# Patient Record
Sex: Female | Born: 1985 | Race: Black or African American | Hispanic: No | Marital: Single | Smoking: Never smoker
Health system: Southern US, Community
[De-identification: ages and names within clinical notes are randomized; demographics above are authoritative.]

## PROBLEM LIST (undated history)

## (undated) DIAGNOSIS — E119 Type 2 diabetes mellitus without complications: Secondary | ICD-10-CM

## (undated) DIAGNOSIS — J45909 Unspecified asthma, uncomplicated: Secondary | ICD-10-CM

## (undated) DIAGNOSIS — I10 Essential (primary) hypertension: Secondary | ICD-10-CM

## (undated) HISTORY — PX: NO PAST SURGERIES: SHX2092

## (undated) HISTORY — PX: SLEEVE GASTROPLASTY: SHX1101

---

## 2010-10-02 ENCOUNTER — Emergency Department (HOSPITAL_COMMUNITY): Payer: Commercial Managed Care - PPO

## 2010-10-02 ENCOUNTER — Emergency Department (HOSPITAL_COMMUNITY)
Admission: EM | Admit: 2010-10-02 | Discharge: 2010-10-02 | Disposition: A | Discharge status: Home / Self Care | Payer: Commercial Managed Care - PPO | Attending: Emergency Medicine | Admitting: Emergency Medicine

## 2010-10-02 DIAGNOSIS — X500XXA Overexertion from strenuous movement or load, initial encounter: Secondary | ICD-10-CM | POA: Insufficient documentation

## 2010-10-02 DIAGNOSIS — M25579 Pain in unspecified ankle and joints of unspecified foot: Secondary | ICD-10-CM | POA: Insufficient documentation

## 2010-10-02 DIAGNOSIS — S93409A Sprain of unspecified ligament of unspecified ankle, initial encounter: Secondary | ICD-10-CM | POA: Insufficient documentation

## 2010-10-02 DIAGNOSIS — Y92009 Unspecified place in unspecified non-institutional (private) residence as the place of occurrence of the external cause: Secondary | ICD-10-CM | POA: Insufficient documentation

## 2012-10-06 ENCOUNTER — Emergency Department (HOSPITAL_COMMUNITY)
Admission: EM | Admit: 2012-10-06 | Discharge: 2012-10-06 | Disposition: A | Discharge status: Home / Self Care | Payer: Self-pay | Attending: Emergency Medicine | Admitting: Emergency Medicine

## 2012-10-06 ENCOUNTER — Encounter (HOSPITAL_COMMUNITY): Payer: Self-pay | Admitting: Emergency Medicine

## 2012-10-06 DIAGNOSIS — J45909 Unspecified asthma, uncomplicated: Secondary | ICD-10-CM | POA: Insufficient documentation

## 2012-10-06 DIAGNOSIS — R42 Dizziness and giddiness: Secondary | ICD-10-CM | POA: Insufficient documentation

## 2012-10-06 DIAGNOSIS — R5381 Other malaise: Secondary | ICD-10-CM | POA: Insufficient documentation

## 2012-10-06 DIAGNOSIS — R110 Nausea: Secondary | ICD-10-CM | POA: Insufficient documentation

## 2012-10-06 DIAGNOSIS — R5383 Other fatigue: Secondary | ICD-10-CM

## 2012-10-06 HISTORY — DX: Unspecified asthma, uncomplicated: J45.909

## 2012-10-06 HISTORY — DX: Type 2 diabetes mellitus without complications: E11.9

## 2012-10-06 LAB — BASIC METABOLIC PANEL
BUN: 9 mg/dL (ref 6–23)
Calcium: 9.1 mg/dL (ref 8.4–10.5)
Creatinine, Ser: 0.67 mg/dL (ref 0.50–1.10)
GFR calc Af Amer: >90 mL/min (ref >90)
GFR calc non Af Amer: >90 mL/min (ref >90)
Glucose, Bld: 82 mg/dL (ref 70–99)

## 2012-10-06 LAB — URINALYSIS, ROUTINE W REFLEX MICROSCOPIC
Leukocytes, UA: NEGATIVE
Nitrite: NEGATIVE
Protein, ur: NEGATIVE mg/dL
Specific Gravity, Urine: 1.012 (ref 1.005–1.030)
Urobilinogen, UA: 1 mg/dL (ref 0.0–1.0)

## 2012-10-06 LAB — GLUCOSE, CAPILLARY: Glucose-Capillary: 111 mg/dL — ABNORMAL HIGH (ref 70–99)

## 2012-10-06 LAB — URINE MICROSCOPIC-ADD ON

## 2012-10-06 NOTE — ED Notes (Signed)
POCT CBG resulted 111; RN notified

## 2012-10-06 NOTE — ED Notes (Signed)
Pt st's she was dx with diabetes last year but has not had any medication in a long time or had her sugar checked.  Today pt c/o feeling lighted, shaky and nauseous.

## 2012-10-06 NOTE — ED Provider Notes (Signed)
History     CSN: [id number]  Arrival date & time 10/06/12  1755   First MD Initiated Contact with Patient 10/06/12 1926      Chief Complaint  Patient presents with  . Diabetes    (Consider location/radiation/quality/duration/timing/severity/associated sxs/prior treatment) HPI Comments: Patient is a 27 year old female with a PMH of diabetes who presents with a 1 day history of fatigue. Symptoms started gradually and progressively worsened. Associated symptoms include nausea and lightheadedness. No aggravating/alleviating factors. She did not try anything for symptoms. She reports she has eaten today.   Patient is a 27 y.o. female presenting with diabetes problem.  Diabetes Associated symptoms include fatigue.    Past Medical History  Diagnosis Date  . Diabetes mellitus without complication   . Asthma     History reviewed. No pertinent past surgical history.  No family history on file.  History  Substance Use Topics  . Smoking status: Never Smoker   . Smokeless tobacco: Not on file  . Alcohol Use: No    OB History   Grav Para Term Preterm Abortions TAB SAB Ect Mult Living                  Review of Systems  Constitutional: Positive for fatigue.  All other systems reviewed and are negative.    Allergies  Review of patient's allergies indicates not on file.  Home Medications  No current outpatient prescriptions on file.  BP 109/52  Pulse 69  Temp(Src) 98.1 F (36.7 C) (Oral)  Resp 18  Ht 5\' 4"  (1.626 m)  Wt 315 lb (142.883 kg)  BMI 54.04 kg/m2  SpO2 99%  LMP 10/06/2012  Physical Exam  Nursing note and vitals reviewed. Constitutional: She appears well-developed and well-nourished. No distress.  HENT:  Head: Normocephalic and atraumatic.  Eyes: Conjunctivae are normal.  Neck: Normal range of motion. Neck supple.  Cardiovascular: Normal rate and regular rhythm.  Exam reveals no gallop and no friction rub.   No murmur heard. Pulmonary/Chest: Effort  normal and breath sounds normal. She has no wheezes. She has no rales. She exhibits no tenderness.  Abdominal: Soft. There is no tenderness.  Musculoskeletal: Normal range of motion.  Neurological: She is alert.  Speech is goal-oriented. Moves limbs without ataxia.   Skin: Skin is warm and dry.  Psychiatric: She has a normal mood and affect. Her behavior is normal.    ED Course  Procedures (including critical care time)  Labs Reviewed  URINALYSIS, ROUTINE W REFLEX MICROSCOPIC - Abnormal; Notable for the following:    Hgb urine dipstick SMALL (*)    All other components within normal limits  GLUCOSE, CAPILLARY - Abnormal; Notable for the following:    Glucose-Capillary 111 (*)    All other components within normal limits  BASIC METABOLIC PANEL  URINE MICROSCOPIC-ADD ON  CBC WITH DIFFERENTIAL  POCT PREGNANCY, URINE   No results found.   1. Fatigue       MDM  8:46 PM Labs and urinalysis unremarkable for infection, pregnancy and any other acute changes. I cannot explain patient's fatigue but it does not appear to be life threatening or warrant further work up. Patient will discharged with instructions to follow up with her PCP. Vitals stable and patient afebrile.         Emilia Beck, [place] 10/06/12 2054

## 2012-10-07 NOTE — ED Provider Notes (Signed)
Medical screening examination/treatment/procedure(s) were performed by non-physician practitioner and as supervising physician I was immediately available for consultation/collaboration.  Jaquis Picklesimer, MD 10/07/12 2111 

## 2013-02-07 ENCOUNTER — Other Ambulatory Visit: Payer: Self-pay

## 2013-02-07 ENCOUNTER — Other Ambulatory Visit (HOSPITAL_COMMUNITY)
Admission: RE | Admit: 2013-02-07 | Discharge: 2013-02-07 | Disposition: A | Discharge status: Home / Self Care | Payer: BC Managed Care – PPO | Source: Ambulatory Visit | Attending: Family Medicine | Admitting: Family Medicine

## 2013-02-07 DIAGNOSIS — Z0000 Encounter for general adult medical examination without abnormal findings: Secondary | ICD-10-CM | POA: Insufficient documentation

## 2013-02-25 ENCOUNTER — Emergency Department (HOSPITAL_COMMUNITY)
Admission: EM | Admit: 2013-02-25 | Discharge: 2013-02-25 | Disposition: A | Discharge status: Home / Self Care | Payer: BC Managed Care – PPO | Source: Home / Self Care | Attending: Emergency Medicine | Admitting: Emergency Medicine

## 2013-02-25 ENCOUNTER — Encounter (HOSPITAL_COMMUNITY): Payer: Self-pay | Admitting: *Deleted

## 2013-02-25 DIAGNOSIS — T63481A Toxic effect of venom of other arthropod, accidental (unintentional), initial encounter: Secondary | ICD-10-CM

## 2013-02-25 DIAGNOSIS — W57XXXA Bitten or stung by nonvenomous insect and other nonvenomous arthropods, initial encounter: Secondary | ICD-10-CM

## 2013-02-25 DIAGNOSIS — T6391XA Toxic effect of contact with unspecified venomous animal, accidental (unintentional), initial encounter: Secondary | ICD-10-CM

## 2013-02-25 MED ORDER — PREDNISONE 10 MG PO TABS: 50.0000 mg | ORAL_TABLET | Freq: Every day | ORAL | Status: DC

## 2013-02-25 MED ORDER — PREDNISONE 20 MG PO TABS
60.0000 mg | ORAL_TABLET | Freq: Once | ORAL | Status: AC
  Administered 2013-02-25: 60 mg via ORAL

## 2013-02-25 MED ORDER — PREDNISONE 20 MG PO TABS
ORAL_TABLET | ORAL | Status: AC
  Filled 2013-02-25: qty 3

## 2013-02-25 NOTE — ED Provider Notes (Signed)
Medical screening examination/treatment/procedure(s) were performed by non-physician practitioner and as supervising physician I was immediately available for consultation/collaboration.  Leslee Home, M.D.  Reuben Likes, MD 02/25/13 [phone]

## 2013-02-25 NOTE — ED Provider Notes (Signed)
  CSN: [id number]     Arrival date & time 02/25/13  0912 History     First MD Initiated Contact with Patient 02/25/13 [phone]     Chief Complaint  Patient presents with  . Rash   (Consider location/radiation/quality/duration/timing/severity/associated sxs/prior Treatment) HPI Comments: Pt was at friend's house visiting for an overnight.  On 2nd day, developed itchy rash on exposed skin areas (arms and legs) that is similar to rash children at friend's house have.  Rash is constant, and since leaving friend's house, no new lesions have appeared. Took benadryl last night and thinks it helped for a short while with the itching.   Patient is a 27 y.o. female presenting with rash. The history is provided by the patient.  Rash Pain location: rash on BUE and BLE. Pain quality comment:  No pain Pain severity:  No pain Progression:  Unchanged Chronicity:  New Relieved by:  Nothing Ineffective treatments: benadryl. Associated symptoms: no chills and no fever   Associated symptoms comment:  Itching    Past Medical History  Diagnosis Date  . Diabetes mellitus without complication   . Asthma    History reviewed. No pertinent past surgical history. History reviewed. No pertinent family history. History  Substance Use Topics  . Smoking status: Never Smoker   . Smokeless tobacco: Not on file  . Alcohol Use: No   OB History   Grav Para Term Preterm Abortions TAB SAB Ect Mult Living                 Review of Systems  Constitutional: Negative for fever and chills.  Skin: Positive for rash.    Allergies  Penicillins  Home Medications   Current Outpatient Rx  Name  Route  Sig  Dispense  Refill  . ALBUTEROL SULFATE HFA IN   Inhalation   Inhale into the lungs as needed.         . predniSONE (DELTASONE) 10 MG tablet   Oral   Take 5 tablets (50 mg total) by mouth daily.   15 tablet   0    BP 153/68  Pulse 76  Temp(Src) 98.2 F (36.8 C) (Oral)  Resp 18  SpO2 97%  LMP  02/24/2013 Physical Exam  Constitutional: She appears well-developed and well-nourished. No distress.  Pulmonary/Chest: Effort normal.  Skin: Skin is warm and dry. Rash noted.  Scattered large, red, raised rash areas approximately 1.5x3cm c/w insect bites. No warmth, tenderness or other sign of infection.     ED Course   Procedures (including critical care time)  Labs Reviewed - No data to display No results found. 1. Insect bites and stings, initial encounter     MDM  Pt with hx allergies, asthma. Given prednisone 60mg  x1 here at [facility] and rx for 50mg  prednisone for 3 more days. Instructed to try loratadine for itching if needed to work/drive, benadryl ok if resting at home.   Cathlyn Parsons, NP 02/25/13 1002

## 2013-02-25 NOTE — ED Notes (Signed)
Was at friend's house Fri and yesterday; startes with pruritic red, raised bumps to BUE and BLE yesterday afternoon.  States friend's children have same rash and mentioned they don't sleep in their bedrooms because it's the "itchy room".  Has taken Benadryl - last dose last night.  No rash noted on torso.

## 2013-04-01 ENCOUNTER — Other Ambulatory Visit (HOSPITAL_COMMUNITY)
Admission: RE | Admit: 2013-04-01 | Discharge: 2013-04-01 | Disposition: A | Discharge status: Home / Self Care | Payer: BC Managed Care – PPO | Source: Ambulatory Visit | Attending: Emergency Medicine | Admitting: Emergency Medicine

## 2013-04-01 ENCOUNTER — Emergency Department (HOSPITAL_COMMUNITY)
Admission: EM | Admit: 2013-04-01 | Discharge: 2013-04-01 | Disposition: A | Discharge status: Home / Self Care | Payer: BC Managed Care – PPO | Source: Home / Self Care | Attending: Emergency Medicine | Admitting: Emergency Medicine

## 2013-04-01 ENCOUNTER — Encounter (HOSPITAL_COMMUNITY): Payer: Self-pay | Admitting: Emergency Medicine

## 2013-04-01 ENCOUNTER — Emergency Department (INDEPENDENT_AMBULATORY_CARE_PROVIDER_SITE_OTHER): Payer: BC Managed Care – PPO

## 2013-04-01 DIAGNOSIS — N920 Excessive and frequent menstruation with regular cycle: Secondary | ICD-10-CM

## 2013-04-01 DIAGNOSIS — Z113 Encounter for screening for infections with a predominantly sexual mode of transmission: Secondary | ICD-10-CM | POA: Insufficient documentation

## 2013-04-01 DIAGNOSIS — K5289 Other specified noninfective gastroenteritis and colitis: Secondary | ICD-10-CM

## 2013-04-01 DIAGNOSIS — N921 Excessive and frequent menstruation with irregular cycle: Secondary | ICD-10-CM

## 2013-04-01 DIAGNOSIS — K529 Noninfective gastroenteritis and colitis, unspecified: Secondary | ICD-10-CM

## 2013-04-01 DIAGNOSIS — N760 Acute vaginitis: Secondary | ICD-10-CM | POA: Insufficient documentation

## 2013-04-01 LAB — CBC WITH DIFFERENTIAL/PLATELET
Eosinophils Relative: 2 % (ref 0–5)
Hemoglobin: 11.7 g/dL — ABNORMAL LOW (ref 12.0–15.0)
Lymphocytes Relative: 35 % (ref 12–46)
Lymphs Abs: 3 10*3/uL (ref 0.7–4.0)
MCV: 85.7 fL (ref 78.0–100.0)
Monocytes Relative: 6 % (ref 3–12)
Neutrophils Relative %: 58 % (ref 43–77)
Platelets: 320 10*3/uL (ref 150–400)
RBC: 4.14 MIL/uL (ref 3.87–5.11)
WBC: 8.6 10*3/uL (ref 4.0–10.5)

## 2013-04-01 LAB — POCT URINALYSIS DIP (DEVICE)
Bilirubin Urine: NEGATIVE
Glucose, UA: 100 mg/dL — AB
Ketones, ur: NEGATIVE mg/dL
Nitrite: NEGATIVE
Specific Gravity, Urine: >=1.03 (ref 1.005–1.030)

## 2013-04-01 LAB — POCT I-STAT, CHEM 8
Calcium, Ion: 1.2 mmol/L (ref 1.12–1.23)
Glucose, Bld: 79 mg/dL (ref 70–99)
HCT: 39 % (ref 36.0–46.0)
Hemoglobin: 13.3 g/dL (ref 12.0–15.0)

## 2013-04-01 MED ORDER — ONDANSETRON 8 MG PO TBDP: 8.0000 mg | ORAL_TABLET | Freq: Three times a day (TID) | ORAL | Status: DC | PRN

## 2013-04-01 MED ORDER — OXYCODONE-ACETAMINOPHEN 5-325 MG PO TABS: ORAL_TABLET | ORAL | Status: DC

## 2013-04-01 MED ORDER — CIPROFLOXACIN HCL 500 MG PO TABS: 500.0000 mg | ORAL_TABLET | Freq: Two times a day (BID) | ORAL | Status: DC

## 2013-04-01 MED ORDER — METRONIDAZOLE 500 MG PO TABS: 500.0000 mg | ORAL_TABLET | Freq: Three times a day (TID) | ORAL | Status: DC

## 2013-04-01 NOTE — ED Provider Notes (Signed)
Chief Complaint:   Chief Complaint  Patient presents with  . Abdominal Pain    History of Present Illness:   Virginia Davis is a 27 year old female who presents with 2 problems: Nausea, vomiting, and diarrhea, as well as a long-standing history of daily vaginal bleeding.  1. Nausea, vomiting, and diarrhea: This is been going on for about 3 weeks. She last vomited this past Tuesday which was 6 days ago. She doesn't have much of an appetite, but she's not sure whether she has lost any weight or not. The patient has loose stools after each meal, about 3 or 4 times a day. There's been no blood in the vomitus or the stool. No melena, coffee-ground emesis, or bilious emesis. She has had bilateral lower abdominal pain radiating through to her back, this is sharp and rated 10 over 10 in intensity. It's constant. The patient has been having pain in this area since 2010. It's not clear whether it's any worse than usual. She denies any fever or chills. No foreign travel, suspicious ingestions, sick exposures, or animal exposure.  2. Vaginal bleeding: The patient states she's had continuous vaginal bleeding which occurs daily since 2010. This varies from some light spotting to sometimes heavy bleeding. It's been associated with bilateral lower abdominal pain which is severe at times. She has been to an infertility specialist, a gynecologist at [facility] physicians at [facility], and [facility]. She's not certain what workup has been done, but states she's never been told that there's been any abnormality found.  Review of Systems:  Other than noted above, the patient denies any of the following symptoms: Systemic:  No fever, chills, sweats, or weight loss. GI:  No abdominal pain, nausea, anorexia, vomiting, diarrhea, constipation, melena or hematochezia. GU:  No dysuria, frequency, urgency, hematuria, vaginal discharge, itching, or abnormal vaginal bleeding. Skin:  No rash or itching.  PMFSH:   Past medical history, family history, social history, meds, and allergies were reviewed.  She is allergic to penicillin. Her only medication is birth control pills. She was recently changed to a new birth control pill and she was attributing the nausea, vomiting, and diarrhea to the change in birth control. She has a history of asthma.  Physical Exam:   Vital signs:  BP 128/64  Pulse 76  Temp(Src) 98.5 F (36.9 C) (Oral)  Resp 18  SpO2 100%  LMP 04/01/2013 General:  Alert, oriented and in no distress. Lungs:  Breath sounds clear and equal bilaterally.  No wheezes, rales or rhonchi. Heart:  Regular rhythm.  No gallops or murmers. Abdomen:  Soft, flat and non-distended.  No organomegaly or mass.  No tenderness, guarding or rebound.  Bowel sounds normally active. Pelvic exam:  Normal external genitalia. Vaginal and cervical mucosa were normal. There was a moderate amount of blood in the vaginal vault. No clots or tissue. The cervix was soft and nontender with movement. Uterus was normal in size and shape and nontender. There is no adnexal tenderness or mass. Skin:  Clear, warm and dry.  Labs:   Results for orders placed during the hospital encounter of 04/01/13  CBC WITH DIFFERENTIAL      Result Value Range   WBC 8.6  4.0 - 10.5 K/uL   RBC 4.14  3.87 - 5.11 MIL/uL   Hemoglobin 11.7 (*) 12.0 - 15.0 g/dL   HCT 08.6 (*) 57.8 - 46.9 %   MCV 85.7  78.0 - 100.0 fL   MCH 28.3  26.0 -  34.0 pg   MCHC 33.0  30.0 - 36.0 g/dL   RDW 62.9  52.8 - 41.3 %   Platelets 320  150 - 400 K/uL   Neutrophils Relative % 58  43 - 77 %   Neutro Abs 5.0  1.7 - 7.7 K/uL   Lymphocytes Relative 35  12 - 46 %   Lymphs Abs 3.0  0.7 - 4.0 K/uL   Monocytes Relative 6  3 - 12 %   Monocytes Absolute 0.5  0.1 - 1.0 K/uL   Eosinophils Relative 2  0 - 5 %   Eosinophils Absolute 0.2  0.0 - 0.7 K/uL   Basophils Relative 0  0 - 1 %   Basophils Absolute 0.0  0.0 - 0.1 K/uL  POCT URINALYSIS DIP (DEVICE)      Result Value  Range   Glucose, UA 100 (*) NEGATIVE mg/dL   Bilirubin Urine NEGATIVE  NEGATIVE   Ketones, ur NEGATIVE  NEGATIVE mg/dL   Specific Gravity, Urine >=1.030  1.005 - 1.030   Hgb urine dipstick LARGE (*) NEGATIVE   pH 6.0  5.0 - 8.0   Protein, ur 30 (*) NEGATIVE mg/dL   Urobilinogen, UA 0.2  0.0 - 1.0 mg/dL   Nitrite NEGATIVE  NEGATIVE   Leukocytes, UA NEGATIVE  NEGATIVE  POCT PREGNANCY, URINE      Result Value Range   Preg Test, Ur NEGATIVE  NEGATIVE  POCT I-STAT, CHEM 8      Result Value Range   Sodium 140  135 - 145 mEq/L   Potassium 3.8  3.5 - 5.1 mEq/L   Chloride 104  96 - 112 mEq/L   BUN 7  6 - 23 mg/dL   Creatinine, Ser 2.44  0.50 - 1.10 mg/dL   Glucose, Bld 79  70 - 99 mg/dL   Calcium, Ion 0.10  1.12 - 1.23 mmol/L   TCO2 24  0 - 100 mmol/L   Hemoglobin 13.3  12.0 - 15.0 g/dL   HCT 27.2  53.6 - 64.4 %    Dg Abd Acute W/chest  04/01/2013   CLINICAL DATA:  Abdominal pain. Nausea vomiting and diarrhea.  EXAM: ACUTE ABDOMEN SERIES (ABDOMEN 2 VIEW & CHEST 1 VIEW)  COMPARISON:  None.  FINDINGS: There is no evidence of dilated bowel loops or free intraperitoneal air. No radiopaque calculi or other significant radiographic abnormality is seen. Heart size and mediastinal contours are within normal limits. Both lungs are clear.  IMPRESSION: There are no specific features to suggest bowel obstruction. No acute cardiopulmonary disease.   Electronically Signed   By: Signa Kell M.D.   On: 04/01/2013 16:13   Assessment:  The primary encounter diagnosis was Colitis. A diagnosis of Menometrorrhagia was also pertinent to this visit.  1. Her acute problem appears to be that of colitis. This could be infectious or inflammatory. A stool culture and PCR for Clostridium difficile have been obtained. Will treat with Flagyl and Cipro. Suggested followup with Dr. Christella Hartigan in 2 weeks.  2. Her chronic, long-term problem is that of menometrorrhagia. This could be due to polycystic ovarian syndrome, fibroid  tumor, ovarian cyst, or endometriosis. I did advise that she followup with a gynecologist, suggesting [facility] within the next week.  Plan:   1.  Meds:  The following meds were prescribed:   Discharge Medication List as of 04/01/2013  4:36 PM    START taking these medications   Details  ciprofloxacin (CIPRO) 500 MG tablet Take 1  tablet (500 mg total) by mouth every 12 (twelve) hours., Starting 04/01/2013, Until Discontinued, Normal    metroNIDAZOLE (FLAGYL) 500 MG tablet Take 1 tablet (500 mg total) by mouth 3 (three) times daily., Starting 04/01/2013, Until Discontinued, Normal    ondansetron (ZOFRAN ODT) 8 MG disintegrating tablet Take 1 tablet (8 mg total) by mouth every 8 (eight) hours as needed for nausea., Starting 04/01/2013, Until Discontinued, Normal    oxyCODONE-acetaminophen (PERCOCET) 5-325 MG per tablet 1 to 2 tablets every 6 hours as needed for pain., Print        2.  Patient Education/Counseling:  The patient was given appropriate handouts, self care instructions, and instructed in symptomatic relief.    3.  Follow up:  The patient was told to follow up if no better in 3 to 4 days, if becoming worse in any way, and given some red flag symptoms such as worsening pain, fever, or persistent vomiting which would prompt immediate return.  Follow up with Dr. Christella Hartigan in 2 weeks and with [facility] in one week.     Reuben Likes, MD 04/01/13 2115

## 2013-04-01 NOTE — ED Notes (Signed)
Pt c/o lower abdominal pain x 3 weeks. Recently switched to a new birth control a stronger dose. Has seen OBGYN for continuous menstrual bleed since 2010. Pt has not been eating due to n/v/d. Felt warm like she has a fever earlier today. Pt reports heating pad makes her feel better. Pt is alert and oriented.

## 2013-04-03 LAB — URINE CULTURE: Colony Count: NO GROWTH

## 2013-04-04 ENCOUNTER — Encounter: Payer: Self-pay | Admitting: Gastroenterology

## 2013-04-04 ENCOUNTER — Encounter: Payer: BC Managed Care – PPO | Attending: Family | Admitting: *Deleted

## 2013-04-04 ENCOUNTER — Encounter: Payer: Self-pay | Admitting: *Deleted

## 2013-04-04 VITALS — Ht 65.5 in | Wt 314.8 lb

## 2013-04-04 DIAGNOSIS — Z713 Dietary counseling and surveillance: Secondary | ICD-10-CM | POA: Insufficient documentation

## 2013-04-04 DIAGNOSIS — E669 Obesity, unspecified: Secondary | ICD-10-CM | POA: Insufficient documentation

## 2013-04-04 NOTE — Progress Notes (Signed)
  Medical Nutrition Therapy:  Appt start time: 1600 end time:  1700.  Assessment:  Primary concerns today: Virginia Davis is here for nutrition counseling pertaining to obesity.  She took phentermine in the past and wanted to take them again, but her doctor advised her to see a RD.  She has always been overweight. At 11-27 years old she was 290 pounds.  Her heaviest weight was 326 in June of this year (12 pound weight loss) via exercising.   She has attempted Weight Watchers and diet pills.  Her mother's side of the family is overweight and multiple family members have had bariatric surgery.  Virginia Davis is not interested in bariatric surgery.  She would like to weigh less than 300 pounds.   She has abnormal vaginal bleeding and is seeing a reproductive endocrinologist, Dr. Elesa Hacker.  She has been bleeding steadily since 2010.  Prior to 2010 she had very irregular period.  At an urgent care center she was examined for abdominal pain and referred to [facility] for ultrasound as a possible PCOS diagnosis.  She presents with hirsutism, denies body acne, but also has acanthosis.   MEDICATIONS: see list.  She was prescribed metformin, but stopped taking it due to GI distress.   DIETARY INTAKE:  Usual eating pattern includes 2-3 meals and 2-3 snacks per day.  Everyday foods include proteins, starches, sugars.  Avoided foods include beef and pork.    24-hr recall: hasn't eaten much lately due to abdominal pain and vomiting B ( AM): skips most of the time  Snk ( AM): not usually  L ( PM): chicken and dumplins and corn with peaches;  Snk ( PM): animal crackers and milk; chex mix and juice D ( PM): cereal; might go out and get fried foods, but at home has grilled or baked meats.  Eats vegetables sometimes Snk ( PM): graham crackers and peanut butter.  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  Estimated energy  needs: 2000 calories 225 g carbohydrates 150 g protein 56 g fat   Nutritional Diagnosis:  [place]-3.3 Overweight/obesity As related to possible PCOS.  As evidenced by BMI >30.    Intervention:  Nutrition counseling provided.  Discussed possibility of PCOS and its signs/symptoms. Referred for further diagnosis Goals: Aim for a more active lifestyle: ride bike 3 days/week 20-30 minutes.   Aim for 3 meals/day avoid meal skipping: breakfast smoothie, english muffin with peanut butter, egg muffin; yogurt, cereal Limit juices: slice oranges and add water.     Monitoring/Evaluation:  Dietary intake, exercise, and body weight in 6 week(s).

## 2013-04-04 NOTE — Patient Instructions (Addendum)
Aim for a more active lifestyle: ride bike 3 days/week 20-30 minutes.   Aim for 3 meals/day avoid meal skipping: breakfast smoothie, english muffin with peanut butter, egg muffin; yogurt, cereal Limit juices: slice oranges and add water.

## 2013-04-04 NOTE — ED Notes (Signed)
Lab review, no growth on urine culture

## 2013-04-04 NOTE — ED Notes (Signed)
Lab review

## 2013-04-09 ENCOUNTER — Encounter (HOSPITAL_COMMUNITY): Payer: Self-pay | Admitting: *Deleted

## 2013-04-09 DIAGNOSIS — E119 Type 2 diabetes mellitus without complications: Secondary | ICD-10-CM | POA: Insufficient documentation

## 2013-04-09 DIAGNOSIS — J45909 Unspecified asthma, uncomplicated: Secondary | ICD-10-CM | POA: Insufficient documentation

## 2013-04-09 DIAGNOSIS — N898 Other specified noninflammatory disorders of vagina: Secondary | ICD-10-CM | POA: Insufficient documentation

## 2013-04-09 DIAGNOSIS — R109 Unspecified abdominal pain: Secondary | ICD-10-CM | POA: Insufficient documentation

## 2013-04-09 LAB — COMPREHENSIVE METABOLIC PANEL
ALT: 10 U/L (ref 0–35)
Alkaline Phosphatase: 59 U/L (ref 39–117)
BUN: 11 mg/dL (ref 6–23)
CO2: 24 mEq/L (ref 19–32)
Calcium: 8.8 mg/dL (ref 8.4–10.5)
Chloride: 103 mEq/L (ref 96–112)
Creatinine, Ser: 0.6 mg/dL (ref 0.50–1.10)
GFR calc Af Amer: >90 mL/min (ref >90)
GFR calc non Af Amer: >90 mL/min (ref >90)
Glucose, Bld: 106 mg/dL — ABNORMAL HIGH (ref 70–99)
Sodium: 138 mEq/L (ref 135–145)

## 2013-04-09 LAB — CBC WITH DIFFERENTIAL/PLATELET
Eosinophils Relative: 2 % (ref 0–5)
Lymphocytes Relative: 27 % (ref 12–46)
Lymphs Abs: 2.7 10*3/uL (ref 0.7–4.0)
MCH: 27.5 pg (ref 26.0–34.0)
MCHC: 32.4 g/dL (ref 30.0–36.0)
MCV: 84.9 fL (ref 78.0–100.0)
Monocytes Absolute: 0.6 10*3/uL (ref 0.1–1.0)
Monocytes Relative: 6 % (ref 3–12)
RDW: 14.6 % (ref 11.5–15.5)

## 2013-04-09 NOTE — ED Notes (Signed)
Pt seen at urgent care on the 21st and was told she had colitis. Pt states that she has been having vaginal bleeding as well (pt seen at urgent care for this as well and believes this is where the pain is coming from) Pt states that she has follow up appointment but she is in too much pain currently to wait to next month.

## 2013-04-10 ENCOUNTER — Inpatient Hospital Stay (HOSPITAL_COMMUNITY): Payer: BC Managed Care – PPO

## 2013-04-10 ENCOUNTER — Encounter (HOSPITAL_COMMUNITY): Payer: Self-pay

## 2013-04-10 ENCOUNTER — Inpatient Hospital Stay (HOSPITAL_COMMUNITY)
Admission: AD | Admit: 2013-04-10 | Discharge: 2013-04-10 | Disposition: A | Discharge status: Home / Self Care | Payer: BC Managed Care – PPO | Source: Ambulatory Visit | Attending: Obstetrics & Gynecology | Admitting: Obstetrics & Gynecology

## 2013-04-10 ENCOUNTER — Emergency Department (HOSPITAL_COMMUNITY)
Admission: EM | Admit: 2013-04-10 | Discharge: 2013-04-10 | Discharge status: Against Medical Advice | Payer: BC Managed Care – PPO | Attending: Emergency Medicine | Admitting: Emergency Medicine

## 2013-04-10 DIAGNOSIS — N949 Unspecified condition associated with female genital organs and menstrual cycle: Secondary | ICD-10-CM | POA: Insufficient documentation

## 2013-04-10 DIAGNOSIS — N926 Irregular menstruation, unspecified: Secondary | ICD-10-CM

## 2013-04-10 DIAGNOSIS — R109 Unspecified abdominal pain: Secondary | ICD-10-CM | POA: Insufficient documentation

## 2013-04-10 DIAGNOSIS — N938 Other specified abnormal uterine and vaginal bleeding: Secondary | ICD-10-CM | POA: Insufficient documentation

## 2013-04-10 DIAGNOSIS — N390 Urinary tract infection, site not specified: Secondary | ICD-10-CM | POA: Insufficient documentation

## 2013-04-10 DIAGNOSIS — N939 Abnormal uterine and vaginal bleeding, unspecified: Secondary | ICD-10-CM

## 2013-04-10 LAB — CBC
HCT: 30.7 % — ABNORMAL LOW (ref 36.0–46.0)
MCHC: 32.6 g/dL (ref 30.0–36.0)
MCV: 85.3 fL (ref 78.0–100.0)
RDW: 14.5 % (ref 11.5–15.5)

## 2013-04-10 LAB — URINALYSIS, ROUTINE W REFLEX MICROSCOPIC
Bilirubin Urine: NEGATIVE
Ketones, ur: 15 mg/dL — AB
Nitrite: POSITIVE — AB
Specific Gravity, Urine: 1.025 (ref 1.005–1.030)
Urobilinogen, UA: 4 mg/dL — ABNORMAL HIGH (ref 0.0–1.0)

## 2013-04-10 LAB — URINE MICROSCOPIC-ADD ON

## 2013-04-10 MED ORDER — OXYCODONE-ACETAMINOPHEN 5-325 MG PO TABS
ORAL_TABLET | ORAL | Status: AC
  Filled 2013-04-10: qty 2

## 2013-04-10 MED ORDER — FERROUS SULFATE 325 (65 FE) MG PO TABS: 325.0000 mg | ORAL_TABLET | Freq: Every day | ORAL | Status: DC

## 2013-04-10 MED ORDER — SULFAMETHOXAZOLE-TRIMETHOPRIM 800-160 MG PO TABS: 1.0000 | ORAL_TABLET | Freq: Two times a day (BID) | ORAL | Status: AC

## 2013-04-10 MED ORDER — IBUPROFEN 800 MG PO TABS: 800.0000 mg | ORAL_TABLET | Freq: Three times a day (TID) | ORAL | Status: DC

## 2013-04-10 MED ORDER — NORETHINDRONE-ETH ESTRADIOL 0.4-35 MG-MCG PO TABS: 2.0000 | ORAL_TABLET | Freq: Every day | ORAL | Status: DC

## 2013-04-10 MED ORDER — OXYCODONE-ACETAMINOPHEN 5-325 MG PO TABS
2.0000 | ORAL_TABLET | Freq: Once | ORAL | Status: AC
  Administered 2013-04-10: 2 via ORAL

## 2013-04-10 NOTE — MAU Note (Signed)
Patient state she has a history of irregular periods since she started her periods at age 27. States she has had bleeding almost every day for the past 2 years, worse for the past 2 weeks. Has had abdominal cramping since 9-21. Patient sees Dr. Elesa Hacker in [facility] for infertility. Was seen at Urgent Care on 9-21 and [place] today but left before seeing a MD.

## 2013-04-10 NOTE — MAU Note (Signed)
Pt states she's been having heavy bleeding that is soaking through a tampon, pad and her clothes that started 2 weeks ago with lower abdominal and back pain.

## 2013-04-10 NOTE — MAU Provider Note (Signed)
History     CSN: [id number]  Arrival date and time: 04/10/13 1821   First Provider Initiated Contact with Patient 04/10/13 1942      Chief Complaint  Patient presents with  . Abdominal Pain  . Vaginal Bleeding   HPI Ms. Virginia Davis is a 27 y.o. G0 who presents to MAU today with complaint of vaginal bleeding and lower abdominal pain. The patient states that she has been having bleeding most days since 2010. She has seen numerous providers since that time, most recently Dr. Elesa Hacker in [facility] [place] who put her on OCPs. The patient already has an appointment with [facility] clinic on 04/25/13. The patient states that her lower abdominal pain is 10/10 now. She took 1/2 a Microbiologist at 1300 with some relief. She denies vaginal discharge, fever, dizziness, weakness, N/V/D or constipation or feeling lightheaded. The patient was seen at Urgent Care on 04/01/13 and had a pelvic exam that was negative for infection. Patient also had a normal pap smear this year.   OB History   Grav Para Term Preterm Abortions TAB SAB Ect Mult Living                  Past Medical History  Diagnosis Date  . Diabetes mellitus without complication   . Asthma     History reviewed. No pertinent past surgical history.  Family History  Problem Relation Age of Onset  . Gestational diabetes Mother     History  Substance Use Topics  . Smoking status: Never Smoker   . Smokeless tobacco: Not on file  . Alcohol Use: No    Allergies:  Allergies  Allergen Reactions  . Food     pistacho  . Penicillins   . Shellfish Allergy     Prescriptions prior to admission  Medication Sig Dispense Refill  . ciprofloxacin (CIPRO) 500 MG tablet Take 1 tablet (500 mg total) by mouth every 12 (twelve) hours.  20 tablet  0  . metroNIDAZOLE (FLAGYL) 500 MG tablet Take 1 tablet (500 mg total) by mouth 3 (three) times daily.  30 tablet  0  . norethindrone-ethinyl estradiol (OVCON-35,BALZIVA,BRIELLYN) 0.4-35 MG-MCG tablet Take 1  tablet by mouth daily.      . ondansetron (ZOFRAN ODT) 8 MG disintegrating tablet Take 1 tablet (8 mg total) by mouth every 8 (eight) hours as needed for nausea.  20 tablet  0  . oxyCODONE-acetaminophen (PERCOCET) 5-325 MG per tablet 1 to 2 tablets every 6 hours as needed for pain.  20 tablet  0  . ALBUTEROL SULFATE HFA IN Inhale into the lungs as needed.      . predniSONE (DELTASONE) 10 MG tablet Take 5 tablets (50 mg total) by mouth daily.  15 tablet  0    Review of Systems  Constitutional: Negative for fever and malaise/fatigue.  Gastrointestinal: Positive for abdominal pain. Negative for nausea, vomiting, diarrhea and constipation.  Genitourinary: Negative for dysuria, urgency and frequency.       Neg - vaginal discharge + vaginal bleeding  Neurological: Negative for dizziness, loss of consciousness and weakness.   Physical Exam   Blood pressure 126/76, pulse 82, temperature 98 F (36.7 C), temperature source Oral, resp. rate 20, height 5' 5.5" (1.664 m), weight 314 lb 12.8 oz (142.792 kg), last menstrual period 04/01/2013, SpO2 98.00%.  Physical Exam  Constitutional: She is oriented to person, place, and time. She appears well-developed and well-nourished. No distress.  HENT:  Head: Normocephalic and atraumatic.  Cardiovascular: Normal  rate, regular rhythm and normal heart sounds.   Respiratory: Effort normal and breath sounds normal. No respiratory distress.  GI: Soft. Bowel sounds are normal. She exhibits no distension and no mass. There is tenderness (mild tenderness to palpation of the lower abdomen bilaterally). There is no rebound and no guarding.  Genitourinary:  Patient refused pelvic exam. Had one last week at Urgent Care. Patient had ~ 4cm x 2 cm area of blood on her pad since 5:30pm today  Neurological: She is alert and oriented to person, place, and time.  Skin: Skin is warm and dry. No erythema.  Psychiatric: She has a normal mood and affect.   Results for orders  placed during the hospital encounter of 04/10/13 (from the past 24 hour(s))  URINALYSIS, ROUTINE W REFLEX MICROSCOPIC     Status: Abnormal   Collection Time    04/10/13  6:40 PM      Result Value Range   Color, Urine RED (*) YELLOW   APPearance TURBID (*) CLEAR   Specific Gravity, Urine 1.025  1.005 - 1.030   pH 5.0  5.0 - 8.0   Glucose, UA 100 (*) NEGATIVE mg/dL   Hgb urine dipstick LARGE (*) NEGATIVE   Bilirubin Urine NEGATIVE  NEGATIVE   Ketones, ur 15 (*) NEGATIVE mg/dL   Protein, ur >409 (*) NEGATIVE mg/dL   Urobilinogen, UA 4.0 (*) 0.0 - 1.0 mg/dL   Nitrite POSITIVE (*) NEGATIVE   Leukocytes, UA MODERATE (*) NEGATIVE  URINE MICROSCOPIC-ADD ON     Status: None   Collection Time    04/10/13  6:40 PM      Result Value Range   Urine-Other FIELD OBSCURED BY RBC'S    POCT PREGNANCY, URINE     Status: None   Collection Time    04/10/13  7:01 PM      Result Value Range   Preg Test, Ur NEGATIVE  NEGATIVE  CBC     Status: Abnormal   Collection Time    04/10/13  7:54 PM      Result Value Range   WBC 9.1  4.0 - 10.5 K/uL   RBC 3.60 (*) 3.87 - 5.11 MIL/uL   Hemoglobin 10.0 (*) 12.0 - 15.0 g/dL   HCT 81.1 (*) 91.4 - 78.2 %   MCV 85.3  78.0 - 100.0 fL   MCH 27.8  26.0 - 34.0 pg   MCHC 32.6  30.0 - 36.0 g/dL   RDW 95.6  21.3 - 08.6 %   Platelets 224  150 - 400 K/uL   US Transvaginal Non-ob  04/10/2013   CLINICAL DATA:  Dysfunctional uterine bleeding  EXAM: TRANSABDOMINAL AND TRANSVAGINAL ULTRASOUND OF PELVIS  TECHNIQUE: Both transabdominal and transvaginal ultrasound examinations of the pelvis were performed. Transabdominal technique was performed for global imaging of the pelvis including uterus, ovaries, adnexal regions, and pelvic cul-de-sac. It was necessary to proceed with endovaginal exam following the transabdominal exam to visualize the endometrium.  COMPARISON:  None  FINDINGS: Uterus  Measurements: 8.2 x 5.0 x 5.4 cm. No fibroids or other mass visualized.  Endometrium   Thickness: 23 mm.  No focal abnormality visualized.  Right ovary  Measurements: 3.0 x 1.8 x 2.0 cm. Normal appearance/no adnexal mass.  Left ovary  Measurements: 2.7 x 1.6 x 1.6 cm. Normal appearance/no adnexal mass.  Other findings  No free fluid.  IMPRESSION: Endometrial complex measures 23 mm.  If bleeding remains unresponsive to hormonal or medical therapy, focal lesion work-up with sonohysterogram should  be considered. Endometrial biopsy should also be considered in pre-menopausal patients at high risk for endometrial carcinoma. (Ref: Radiological Reasoning: Algorithmic Workup of Abnormal Vaginal Bleeding with Endovaginal Sonography and Sonohysterography. AJR 2008; 161:W96-04).   Electronically Signed   By: Charline Bills M.D.   On: 04/10/2013 20:45   US Pelvis Complete  04/10/2013   CLINICAL DATA:  Dysfunctional uterine bleeding  EXAM: TRANSABDOMINAL AND TRANSVAGINAL ULTRASOUND OF PELVIS  TECHNIQUE: Both transabdominal and transvaginal ultrasound examinations of the pelvis were performed. Transabdominal technique was performed for global imaging of the pelvis including uterus, ovaries, adnexal regions, and pelvic cul-de-sac. It was necessary to proceed with endovaginal exam following the transabdominal exam to visualize the endometrium.  COMPARISON:  None  FINDINGS: Uterus  Measurements: 8.2 x 5.0 x 5.4 cm. No fibroids or other mass visualized.  Endometrium  Thickness: 23 mm.  No focal abnormality visualized.  Right ovary  Measurements: 3.0 x 1.8 x 2.0 cm. Normal appearance/no adnexal mass.  Left ovary  Measurements: 2.7 x 1.6 x 1.6 cm. Normal appearance/no adnexal mass.  Other findings  No free fluid.  IMPRESSION: Endometrial complex measures 23 mm.  If bleeding remains unresponsive to hormonal or medical therapy, focal lesion work-up with sonohysterogram should be considered. Endometrial biopsy should also be considered in pre-menopausal patients at high risk for endometrial carcinoma. (Ref:  Radiological Reasoning: Algorithmic Workup of Abnormal Vaginal Bleeding with Endovaginal Sonography and Sonohysterography. AJR 2008; 540:J81-19).   Electronically Signed   By: Charline Bills M.D.   On: 04/10/2013 20:45     MAU Course  Procedures None  MDM UPT - negative UA, CBC [place] today Percocet given in MAU for pain Discussed patient with Dr. Debroah Loop. Increase OCPs to 2 tabs daily until clinic follow-up. Also discuss with patient that management of this issue should be done either in [facility] or [facility] and not both Patient prefers to start evaluation and management with [facility] clinic and plans to no longer see Dr. Elesa Hacker.  Assessment and Plan  A: UTI Abnormal uterine bleeding  P: Discharge home Rx for Ferrous Sulfate, OCPs, Ibupforen and Bactrim sent to patient's pharmacy Patient advised to increase dose of OCPs to 2 tabs daily until follow-up in [facility] clinic Patient advised to keep appointment in [facility] clinic for further evaluation and management Patient may return to MAU as needed or if her condition were to change or worsen  Freddi Starr, PA-C  04/10/2013, 8:34 PM

## 2013-04-25 ENCOUNTER — Ambulatory Visit (INDEPENDENT_AMBULATORY_CARE_PROVIDER_SITE_OTHER): Payer: BC Managed Care – PPO | Admitting: Obstetrics and Gynecology

## 2013-04-25 VITALS — BP 138/82 | HR 70 | Temp 98.6°F | Ht 65.0 in | Wt 310.2 lb

## 2013-04-25 DIAGNOSIS — N939 Abnormal uterine and vaginal bleeding, unspecified: Secondary | ICD-10-CM

## 2013-04-25 DIAGNOSIS — N926 Irregular menstruation, unspecified: Secondary | ICD-10-CM

## 2013-04-25 MED ORDER — NORETHINDRONE-ETH ESTRADIOL 0.4-35 MG-MCG PO TABS: 1.0000 | ORAL_TABLET | Freq: Every day | ORAL | Status: DC

## 2013-04-25 MED ORDER — NORETHINDRONE-ETH ESTRADIOL 0.4-35 MG-MCG PO TABS: ORAL_TABLET | ORAL | Status: DC

## 2013-04-25 NOTE — Progress Notes (Signed)
Patient ID: Virginia Davis, female   DOB: [birth year], 27 y.o.   MRN: [id number] 27 yo G0 presenting today for refill on OCP. Patient reports abnormal bleeding since 2010. She states that she has bled daily since 2010 some days are spotting but most days are similar to a period. Patient was evaluated by [facility] and had TSH and other hormone levels checked. Patient is currently under the care of an infertility doctor as she plans to conceive in the next year or so. Patient states the only thing that has helped stop her bleeding was by doubling her OCP Rx which was originally prescribed by infertility MD. She has since ran out and is not due for a refill until the next 2 weeks. She states her bleeding has returned.  Past Medical History  Diagnosis Date  . Diabetes mellitus without complication   . Asthma    No past surgical history on file. Family History  Problem Relation Age of Onset  . Gestational diabetes Mother    History  Substance Use Topics  . Smoking status: Never Smoker   . Smokeless tobacco: Not on file  . Alcohol Use: No    Patient declined physical exam  A/P 27 yo with abnormal uterine bleeding and normal ultrasound - Rx ovcon taper provided - follow up with infertility doctor - rtc prn

## 2013-05-07 ENCOUNTER — Ambulatory Visit: Payer: BC Managed Care – PPO | Admitting: Gastroenterology

## 2013-05-16 ENCOUNTER — Ambulatory Visit: Payer: BC Managed Care – PPO | Admitting: *Deleted

## 2013-05-17 ENCOUNTER — Ambulatory Visit: Payer: BC Managed Care – PPO | Admitting: *Deleted

## 2013-05-21 ENCOUNTER — Encounter: Payer: BC Managed Care – PPO | Attending: Family | Admitting: *Deleted

## 2013-05-21 VITALS — Ht 65.5 in | Wt 306.0 lb

## 2013-05-21 DIAGNOSIS — Z713 Dietary counseling and surveillance: Secondary | ICD-10-CM | POA: Insufficient documentation

## 2013-05-21 DIAGNOSIS — E669 Obesity, unspecified: Secondary | ICD-10-CM | POA: Insufficient documentation

## 2013-05-21 NOTE — Patient Instructions (Signed)
Aim to walk briskly 10 minutes, 4 days a week After 1 month of consistency, walk briskly 20 minutes 4 days a week Schedule MD appointment for blood work, CMP, insulin, estrogen, testosterone, progesterone, thyroid, Celiac panel Aim for 3 meals a day, avoid meal skipping- aim for something small in the morning like apple or yogurt or banana, string cheese.  Do something little in the evening like crackers or fruit or cheese or nuts

## 2013-05-21 NOTE — Progress Notes (Signed)
  Medical Nutrition Therapy:  Appt start time: 1230 end time:  1300.  Assessment:  Primary concerns today: Virginia Davis is here for follow up nutrition counseling pertaining to obesity.  She has lost weight, but that is due mostly to not eating related to GI distress and vaginal bleeding.  She has not felt well enough to eat.  She lost her job and is no longer active and she skips 2 meals each day.   MEDICATIONS: see list.  She was prescribed metformin, but stopped taking it due to GI distress.  She was recently prescribed something for stomach spasms, but hasn't filed the prescription.   DIETARY INTAKE:  Usual eating pattern includes 1-2 meals and 2-3 snacks per day.  Everyday foods include proteins, starches, sugars.  Avoided foods include beef and pork.    24-hr recall: hasn't eaten much lately due to abdominal pain and vomiting B ( AM): skips most of the time  Snk ( AM): not usually  L ( PM): chicken and dumplins and corn with peaches;  Snk ( PM): animal crackers and milk; chex mix and juice D ( PM): skipping lately due to poor appetite Beverages: 2% milk, tea, sometimes water  Usual physical activity: none   Estimated energy needs: 2000 calories 225 g carbohydrates 150 g protein 56 g fat   Nutritional Diagnosis:  NB-1.6 Limited adherence to nutrition-related recommendations As related to increasing physical activity and eating 3 meals/day.  As evidenced by dietary recall.    Intervention:  Nutrition counseling provided.   Goals: Aim to walk briskly 10 minutes, 4 days a week After 1 month of consistency, walk briskly 20 minutes 4 days a week Schedule MD appointment for blood work, CMP, insulin, estrogen, testosterone, progesterone, thyroid, Celiac panel Aim for 3 meals a day, avoid meal skipping- aim for something small in the morning like apple or yogurt or banana, string cheese.  Do something little in the evening like crackers or fruit or cheese or  nuts  Monitoring/Evaluation:  Dietary intake, exercise, lab results, and body weight prn.

## 2015-02-15 ENCOUNTER — Inpatient Hospital Stay (HOSPITAL_COMMUNITY)
Admission: AD | Admit: 2015-02-15 | Discharge: 2015-02-15 | Disposition: A | Discharge status: Home / Self Care | Payer: Medicaid Other | Source: Ambulatory Visit | Attending: Obstetrics & Gynecology | Admitting: Obstetrics & Gynecology

## 2015-02-15 ENCOUNTER — Encounter (HOSPITAL_COMMUNITY): Payer: Self-pay | Admitting: *Deleted

## 2015-02-15 DIAGNOSIS — Z6841 Body Mass Index (BMI) 40.0 and over, adult: Secondary | ICD-10-CM | POA: Diagnosis not present

## 2015-02-15 DIAGNOSIS — N921 Excessive and frequent menstruation with irregular cycle: Secondary | ICD-10-CM | POA: Diagnosis present

## 2015-02-15 DIAGNOSIS — I10 Essential (primary) hypertension: Secondary | ICD-10-CM | POA: Diagnosis not present

## 2015-02-15 DIAGNOSIS — N946 Dysmenorrhea, unspecified: Secondary | ICD-10-CM | POA: Diagnosis not present

## 2015-02-15 DIAGNOSIS — E119 Type 2 diabetes mellitus without complications: Secondary | ICD-10-CM | POA: Diagnosis not present

## 2015-02-15 DIAGNOSIS — E282 Polycystic ovarian syndrome: Secondary | ICD-10-CM | POA: Diagnosis not present

## 2015-02-15 DIAGNOSIS — R109 Unspecified abdominal pain: Secondary | ICD-10-CM | POA: Diagnosis present

## 2015-02-15 LAB — WET PREP, GENITAL
Clue Cells Wet Prep HPF POC: NONE SEEN
TRICH WET PREP: NONE SEEN
YEAST WET PREP: NONE SEEN

## 2015-02-15 LAB — CBC
HCT: 33 % — ABNORMAL LOW (ref 36.0–46.0)
Hemoglobin: 10.3 g/dL — ABNORMAL LOW (ref 12.0–15.0)
MCH: 26.6 pg (ref 26.0–34.0)
MCHC: 31.2 g/dL (ref 30.0–36.0)
MCV: 85.3 fL (ref 78.0–100.0)
PLATELETS: 244 10*3/uL (ref 150–400)
RBC: 3.87 MIL/uL (ref 3.87–5.11)
RDW: 14.6 % (ref 11.5–15.5)
WBC: 7 10*3/uL (ref 4.0–10.5)

## 2015-02-15 LAB — URINALYSIS, ROUTINE W REFLEX MICROSCOPIC
Bilirubin Urine: NEGATIVE
GLUCOSE, UA: NEGATIVE mg/dL
KETONES UR: NEGATIVE mg/dL
LEUKOCYTES UA: NEGATIVE
Nitrite: NEGATIVE
PH: 5.5 (ref 5.0–8.0)
Protein, ur: NEGATIVE mg/dL
Urobilinogen, UA: 0.2 mg/dL (ref 0.0–1.0)

## 2015-02-15 LAB — URINE MICROSCOPIC-ADD ON

## 2015-02-15 LAB — ABO/RH: ABO/RH(D): B POS

## 2015-02-15 LAB — POCT PREGNANCY, URINE: Preg Test, Ur: NEGATIVE

## 2015-02-15 LAB — HIV ANTIBODY (ROUTINE TESTING W REFLEX): HIV Screen 4th Generation wRfx: NONREACTIVE

## 2015-02-15 LAB — GLUCOSE, CAPILLARY: GLUCOSE-CAPILLARY: 100 mg/dL — AB (ref 65–99)

## 2015-02-15 MED ORDER — MEGESTROL ACETATE 40 MG PO TABS: ORAL_TABLET | ORAL | Status: DC

## 2015-02-15 MED ORDER — IBUPROFEN 600 MG PO TABS: 600.0000 mg | ORAL_TABLET | Freq: Four times a day (QID) | ORAL | Status: DC | PRN

## 2015-02-15 NOTE — MAU Provider Note (Signed)
Chief Complaint: Abdominal Pain and Vaginal Bleeding   None     SUBJECTIVE HPI: Virginia Davis is a 29 y.o. G0P0000 who presents to maternity admissions reporting abdominal cramping x 3 weeks and vaginal bleeding daily x3-4 months.  She has history of irregular menses. She has taken OCP taper in the past to reduce bleeding during heavy bleeding episodes.  She reports diagnosis of diabetes in 2014 but is not currently taking medication or seeing MD for diabetes. She has not seen Gyn provider since 2011. She denies vaginal itching/burning, urinary symptoms, h/a, dizziness, n/v, or fever/chills.     Abdominal Pain This is a recurrent problem. The current episode started 1 to 4 weeks ago. The onset quality is gradual. The problem occurs intermittently. The problem has been gradually worsening. The pain is located in the LLQ. The pain is moderate. The quality of the pain is cramping. The abdominal pain radiates to the back. Pertinent negatives include no constipation, diarrhea, dysuria, fever, frequency, headaches, nausea or vomiting. The pain is aggravated by movement. She has tried acetaminophen for the symptoms. The treatment provided no relief.  Vaginal Bleeding The patient's primary symptoms include vaginal bleeding. This is a recurrent problem. The current episode started more than 1 month ago. The problem occurs daily. The problem has been waxing and waning. The pain is moderate. She is not pregnant. Associated symptoms include abdominal pain. Pertinent negatives include no chills, constipation, diarrhea, dysuria, fever, frequency, headaches, nausea, urgency or vomiting. The vaginal bleeding is typical of menses. She has been passing clots. She has not been passing tissue. She has tried acetaminophen and NSAIDs for the symptoms. The treatment provided no relief. She uses nothing for contraception.    Past Medical History  Diagnosis Date  . Asthma   . Diabetes mellitus without complication      not taking metformin anymore, hasn't seen MD for this since 2014   Past Surgical History  Procedure Laterality Date  . No past surgeries     History   Social History  . Marital Status: Single    Spouse Name: N/A  . Number of Children: N/A  . Years of Education: N/A   Occupational History  . Not on file.   Social History Main Topics  . Smoking status: Never Smoker   . Smokeless tobacco: Not on file  . Alcohol Use: No  . Drug Use: No  . Sexual Activity: No   Other Topics Concern  . Not on file   Social History Narrative   No current facility-administered medications on file prior to encounter.   Current Outpatient Prescriptions on File Prior to Encounter  Medication Sig Dispense Refill  . ibuprofen (ADVIL,MOTRIN) 800 MG tablet Take 1 tablet (800 mg total) by mouth 3 (three) times daily. 21 tablet 0  . ALBUTEROL SULFATE HFA IN Inhale into the lungs as needed.    . ferrous sulfate 325 (65 FE) MG tablet Take 1 tablet (325 mg total) by mouth daily. 30 tablet 0  . metroNIDAZOLE (FLAGYL) 500 MG tablet Take 1 tablet (500 mg total) by mouth 3 (three) times daily. 30 tablet 0  . norethindrone-ethinyl estradiol (OVCON-35, 28,) 0.4-35 MG-MCG tablet Take 1 tablet by mouth daily. 1 Package 11  . norethindrone-ethinyl estradiol (OVCON-35,BALZIVA,BRIELLYN) 0.4-35 MG-MCG tablet 3 tabs daily for 3 days, 2 tabs daily for 3 days, then 1 tab daily until completion of active pills. Skip placebo pills and start a new pack 1 Package 0  . ondansetron (ZOFRAN ODT) 8  MG disintegrating tablet Take 1 tablet (8 mg total) by mouth every 8 (eight) hours as needed for nausea. 20 tablet 0  . oxyCODONE-acetaminophen (PERCOCET) 5-325 MG per tablet 1 to 2 tablets every 6 hours as needed for pain. 20 tablet 0  . predniSONE (DELTASONE) 10 MG tablet Take 5 tablets (50 mg total) by mouth daily. 15 tablet 0   Allergies  Allergen Reactions  . Food     pistacho  . Penicillins   . Shellfish Allergy      Review of Systems  Constitutional: Negative for fever, chills and malaise/fatigue.  Eyes: Negative for blurred vision.  Respiratory: Negative for cough and shortness of breath.   Cardiovascular: Negative for chest pain.  Gastrointestinal: Positive for abdominal pain. Negative for heartburn, nausea, vomiting, diarrhea and constipation.  Genitourinary: Positive for vaginal bleeding. Negative for dysuria, urgency and frequency.  Musculoskeletal: Negative.   Neurological: Negative for dizziness and headaches.  Psychiatric/Behavioral: Negative for depression.    OBJECTIVE Blood pressure 152/88, pulse 79, temperature 98.9 F (37.2 C), resp. rate 18, height 5\' 5"  (1.651 m), weight 150.866 kg (332 lb 9.6 oz).   GENERAL: Well-developed, well-nourished female in no acute distress.  EYES: normal sclera/conjunctiva; no lid-lag HENT: Atraumatic, normocephalic HEART: normal rate RESP: normal effort ABDOMEN: Soft, non-tender MUSCULOSKELETAL: Normal ROM EXTREMITIES: Nontender, no edema NEURO/PSYCH: Alert and oriented, appropriate affect  PELVIC EXAM: Cervix pink, visually closed, without lesion, small amount dark red blood, vaginal walls and external genitalia normal Bimanual exam: Cervix 0/long/high, firm, anterior, neg CMT, uterus nontender, nonenlarged, adnexa without tenderness, enlargement, or mass   LAB RESULTS Results for orders placed or performed during the hospital encounter of 02/15/15 (from the past 24 hour(s))  Urinalysis, Routine w reflex microscopic (not at [facility])     Status: Abnormal   Collection Time: 02/15/15  2:00 AM  Result Value Ref Range   Color, Urine YELLOW YELLOW   APPearance CLEAR CLEAR   Specific Gravity, Urine >1.030 (H) 1.005 - 1.030   pH 5.5 5.0 - 8.0   Glucose, UA NEGATIVE NEGATIVE mg/dL   Hgb urine dipstick TRACE (A) NEGATIVE   Bilirubin Urine NEGATIVE NEGATIVE   Ketones, ur NEGATIVE NEGATIVE mg/dL   Protein, ur NEGATIVE NEGATIVE mg/dL   Urobilinogen,  UA 0.2 0.0 - 1.0 mg/dL   Nitrite NEGATIVE NEGATIVE   Leukocytes, UA NEGATIVE NEGATIVE  Urine microscopic-add on     Status: Abnormal   Collection Time: 02/15/15  2:00 AM  Result Value Ref Range   Squamous Epithelial / LPF RARE RARE   WBC, UA 0-2 <3 WBC/hpf   RBC / HPF 3-6 <3 RBC/hpf   Bacteria, UA FEW (A) RARE  Pregnancy, urine POC     Status: None   Collection Time: 02/15/15  2:07 AM  Result Value Ref Range   Preg Test, Ur NEGATIVE NEGATIVE  CBC     Status: Abnormal   Collection Time: 02/15/15  2:15 AM  Result Value Ref Range   WBC 7.0 4.0 - 10.5 K/uL   RBC 3.87 3.87 - 5.11 MIL/uL   Hemoglobin 10.3 (L) 12.0 - 15.0 g/dL   HCT 21.3 (L) 08.6 - 57.8 %   MCV 85.3 78.0 - 100.0 fL   MCH 26.6 26.0 - 34.0 pg   MCHC 31.2 30.0 - 36.0 g/dL   RDW 46.9 62.9 - 52.8 %   Platelets 244 150 - 400 K/uL  ABO/Rh     Status: None (Preliminary result)   Collection Time: 02/15/15  2:15  AM  Result Value Ref Range   ABO/RH(D) B POS   Wet prep, genital     Status: Abnormal   Collection Time: 02/15/15  2:26 AM  Result Value Ref Range   Yeast Wet Prep HPF POC NONE SEEN NONE SEEN   Trich, Wet Prep NONE SEEN NONE SEEN   Clue Cells Wet Prep HPF POC NONE SEEN NONE SEEN   WBC, Wet Prep HPF POC FEW (A) NONE SEEN  Glucose, capillary     Status: Abnormal   Collection Time: 02/15/15  2:54 AM  Result Value Ref Range   Glucose-Capillary 100 (H) 65 - 99 mg/dL    --/--/B POS (47/82 9562)  IMAGING No results found.  ASSESSMENT 1. Menometrorrhagia   2. PCOS (polycystic ovarian syndrome)   3. Morbid obesity with BMI of 50.0-59.9, adult   4. Dysmenorrhea   5. Type 2 diabetes mellitus without complication   6. Essential hypertension     PLAN Discharge home Discussed risks of HTN and DM, pt needs to see primary care asap Megace taper Outpatient U/S ordered F/U in Gyn clinic F/U with PCP  Follow-up Information    Follow up with [facility].   Specialty:  Obstetrics and Gynecology    Why:  The clinic will call you with appointment.   Contact information:   [address] [place] [address] [phone]      Follow up with [facility] [place] [facility]MISS[facility].   Why:  As needed for emergencies   Contact information:   [address] [phone] mc [place] [address] [phone]      Please follow up.   Why:  With your primary care provider as soon as possible      Sharen Counter Certified Nurse-Midwife 02/15/2015  3:09 AM

## 2015-02-15 NOTE — MAU Note (Addendum)
Vag bleeding since April. Cramping for last 3 wks. Some days bleeding is heavy and some days not. Using about 3 pads daily.Some clots slightly larger than quarter size. Hx of irregular bleeding

## 2015-02-15 NOTE — Discharge Instructions (Signed)
Polycystic Ovarian Syndrome Polycystic ovarian syndrome (PCOS) is a common hormonal disorder among women of reproductive age. Most women with PCOS grow many small cysts on their ovaries. PCOS can cause problems with your periods and make it difficult to get pregnant. It can also cause an increased risk of miscarriage with pregnancy. If left untreated, PCOS can lead to serious health problems, such as diabetes and heart disease. CAUSES The cause of PCOS is not fully understood, but genetics may be a factor. SIGNS AND SYMPTOMS   Infrequent or no menstrual periods.   Inability to get pregnant (infertility) because of not ovulating.   Increased growth of hair on the face, chest, stomach, back, thumbs, thighs, or toes.   Acne, oily skin, or dandruff.   Pelvic pain.   Weight gain or obesity, usually carrying extra weight around the waist.   Type 2 diabetes.   High cholesterol.   High blood pressure.   Female-pattern baldness or thinning hair.   Patches of thickened and dark brown or black skin on the neck, arms, breasts, or thighs.   Tiny excess flaps of skin (skin tags) in the armpits or neck area.   Excessive snoring and having breathing stop at times while asleep (sleep apnea).   Deepening of the voice.   Gestational diabetes when pregnant.  DIAGNOSIS  There is no single test to diagnose PCOS.   Your health care provider will:   Take a medical history.   Perform a pelvic exam.   Have ultrasonography done.   Check your female and female hormone levels.   Measure glucose or sugar levels in the blood.   Do other blood tests.   If you are producing too many female hormones, your health care provider will make sure it is from PCOS. At the physical exam, your health care provider will want to evaluate the areas of increased hair growth. Try to allow natural hair growth for a few days before the visit.   During a pelvic exam, the ovaries may be enlarged  or swollen because of the increased number of small cysts. This can be seen more easily by using vaginal ultrasonography or screening to examine the ovaries and lining of the uterus (endometrium) for cysts. The uterine lining may become thicker if you have not been having a regular period.  TREATMENT  Because there is no cure for PCOS, it needs to be managed to prevent problems. Treatments are based on your symptoms. Treatment is also based on whether you want to have a baby or whether you need contraception.  Treatment may include:   Progesterone hormone to start a menstrual period.   Birth control pills to make you have regular menstrual periods.   Medicines to make you ovulate, if you want to get pregnant.   Medicines to control your insulin.   Medicine to control your blood pressure.   Medicine and diet to control your high cholesterol and triglycerides in your blood.  Medicine to reduce excessive hair growth.  Surgery, making small holes in the ovary, to decrease the amount of female hormone production. This is done through a long, lighted tube (laparoscope) placed into the pelvis through a tiny incision in the lower abdomen.  HOME CARE INSTRUCTIONS  Only take over-the-counter or prescription medicine as directed by your health care provider.  Pay attention to the foods you eat and your activity levels. This can help reduce the effects of PCOS.  Keep your weight under control.  Eat foods that are  low in carbohydrate and high in fiber.  Exercise regularly. SEEK MEDICAL CARE IF:  Your symptoms do not get better with medicine.  You have new symptoms. Document Released: 10/22/2004 Document Revised: 04/18/2013 Document Reviewed: 12/14/2012 [facility] Patient Information 2015 [place], [place]. This information is not intended to replace advice given to you by your health care provider. Make sure you discuss any questions you have with your health care  provider.  Hypertension Hypertension, commonly called high blood pressure, is when the force of blood pumping through your arteries is too strong. Your arteries are the blood vessels that carry blood from your heart throughout your body. A blood pressure reading consists of a higher number over a lower number, such as 110/72. The higher number (systolic) is the pressure inside your arteries when your heart pumps. The lower number (diastolic) is the pressure inside your arteries when your heart relaxes. Ideally you want your blood pressure below 120/80. Hypertension forces your heart to work harder to pump blood. Your arteries may become narrow or stiff. Having hypertension puts you at risk for heart disease, stroke, and other problems.  RISK FACTORS Some risk factors for high blood pressure are controllable. Others are not.  Risk factors you cannot control include:   Race. You may be at higher risk if you are African American.  Age. Risk increases with age.  Gender. Men are at higher risk than women before age 50 years. After age 88, women are at higher risk than men. Risk factors you can control include:  Not getting enough exercise or physical activity.  Being overweight.  Getting too much fat, sugar, calories, or salt in your diet.  Drinking too much alcohol. SIGNS AND SYMPTOMS Hypertension does not usually cause signs or symptoms. Extremely high blood pressure (hypertensive crisis) may cause headache, anxiety, shortness of breath, and nosebleed. DIAGNOSIS  To check if you have hypertension, your health care provider will measure your blood pressure while you are seated, with your arm held at the level of your heart. It should be measured at least twice using the same arm. Certain conditions can cause a difference in blood pressure between your right and left arms. A blood pressure reading that is higher than normal on one occasion does not mean that you need treatment. If one blood  pressure reading is high, ask your health care provider about having it checked again. TREATMENT  Treating high blood pressure includes making lifestyle changes and possibly taking medicine. Living a healthy lifestyle can help lower high blood pressure. You may need to change some of your habits. Lifestyle changes may include:  Following the DASH diet. This diet is high in fruits, vegetables, and whole grains. It is low in salt, red meat, and added sugars.  Getting at least 2 hours of brisk physical activity every week.  Losing weight if necessary.  Not smoking.  Limiting alcoholic beverages.  Learning ways to reduce stress. If lifestyle changes are not enough to get your blood pressure under control, your health care provider may prescribe medicine. You may need to take more than one. Work closely with your health care provider to understand the risks and benefits. HOME CARE INSTRUCTIONS  Have your blood pressure rechecked as directed by your health care provider.   Take medicines only as directed by your health care provider. Follow the directions carefully. Blood pressure medicines must be taken as prescribed. The medicine does not work as well when you skip doses. Skipping doses also puts you at  risk for problems.   Do not smoke.   Monitor your blood pressure at home as directed by your health care provider. SEEK MEDICAL CARE IF:   You think you are having a reaction to medicines taken.  You have recurrent headaches or feel dizzy.  You have swelling in your ankles.  You have trouble with your vision. SEEK IMMEDIATE MEDICAL CARE IF:  You develop a severe headache or confusion.  You have unusual weakness, numbness, or feel faint.  You have severe chest or abdominal pain.  You vomit repeatedly.  You have trouble breathing. MAKE SURE YOU:   Understand these instructions.  Will watch your condition.  Will get help right away if you are not doing well or get  worse. Document Released: 06/28/2005 Document Revised: 11/12/2013 Document Reviewed: 04/20/2013 [facility] Patient Information 2015 [place], [place]. This information is not intended to replace advice given to you by your health care provider. Make sure you discuss any questions you have with your health care provider.

## 2015-02-17 LAB — GC/CHLAMYDIA PROBE AMP (~~LOC~~) NOT AT ARMC
Chlamydia: NEGATIVE
NEISSERIA GONORRHEA: NEGATIVE

## 2015-02-19 ENCOUNTER — Ambulatory Visit (HOSPITAL_COMMUNITY)
Admission: RE | Admit: 2015-02-19 | Discharge: 2015-02-19 | Disposition: A | Discharge status: Home / Self Care | Payer: Medicaid Other | Source: Ambulatory Visit | Attending: Advanced Practice Midwife | Admitting: Advanced Practice Midwife

## 2015-02-19 DIAGNOSIS — N921 Excessive and frequent menstruation with irregular cycle: Secondary | ICD-10-CM | POA: Insufficient documentation

## 2015-02-19 DIAGNOSIS — N946 Dysmenorrhea, unspecified: Secondary | ICD-10-CM

## 2015-02-19 DIAGNOSIS — E282 Polycystic ovarian syndrome: Secondary | ICD-10-CM | POA: Diagnosis not present

## 2015-02-21 ENCOUNTER — Ambulatory Visit (INDEPENDENT_AMBULATORY_CARE_PROVIDER_SITE_OTHER): Payer: Medicaid Other | Admitting: Obstetrics & Gynecology

## 2015-02-21 ENCOUNTER — Encounter: Payer: Self-pay | Admitting: Obstetrics & Gynecology

## 2015-02-21 VITALS — BP 132/74 | HR 82 | Temp 98.6°F | Ht 65.0 in | Wt 333.3 lb

## 2015-02-21 DIAGNOSIS — E282 Polycystic ovarian syndrome: Secondary | ICD-10-CM | POA: Diagnosis not present

## 2015-02-21 DIAGNOSIS — N939 Abnormal uterine and vaginal bleeding, unspecified: Secondary | ICD-10-CM | POA: Diagnosis present

## 2015-02-21 MED ORDER — NORETHINDRONE-ETH ESTRADIOL 1-50 MG-MCG PO TABS: 1.0000 | ORAL_TABLET | Freq: Every day | ORAL | Status: DC

## 2015-02-21 NOTE — Progress Notes (Signed)
Pt currently takes Megace which is effective.   Pt reports bleeding since April 2016.

## 2015-02-21 NOTE — Patient Instructions (Signed)
Dysfunctional Uterine Bleeding Normally, menstrual periods begin between ages 11 to 17 in young women. A normal menstrual cycle/period may begin every 23 days up to 35 days and lasts from 1 to 7 days. Around 12 to 14 days before your menstrual period starts, ovulation (ovary produces an egg) occurs. When counting the time between menstrual periods, count from the first day of bleeding of the previous period to the first day of bleeding of the next period. Dysfunctional (abnormal) uterine bleeding is bleeding that is different from a normal menstrual period. Your periods may come earlier or later than usual. They may be lighter, have blood clots or be heavier. You may have bleeding between periods, or you may skip one period or more. You may have bleeding after sexual intercourse, bleeding after menopause, or no menstrual period. CAUSES   Pregnancy (normal, miscarriage, tubal).  IUDs (intrauterine device, birth control).  Birth control pills.  Hormone treatment.  Menopause.  Infection of the cervix.  Blood clotting problems.  Infection of the inside lining of the uterus.  Endometriosis, inside lining of the uterus growing in the pelvis and other female organs.  Adhesions (scar tissue) inside the uterus.  Obesity or severe weight loss.  Uterine polyps inside the uterus.  Cancer of the vagina, cervix, or uterus.  Ovarian cysts or polycystic ovary syndrome.  Medical problems (diabetes, thyroid disease).  Uterine fibroids (noncancerous tumor).  Problems with your female hormones.  Endometrial hyperplasia, very thick lining and enlarged cells inside of the uterus.  Medicines that interfere with ovulation.  Radiation to the pelvis or abdomen.  Chemotherapy. DIAGNOSIS   Your doctor will discuss the history of your menstrual periods, medicines you are taking, changes in your weight, stress in your life, and any medical problems you may have.  Your doctor will do a physical  and pelvic examination.  Your doctor may want to perform certain tests to make a diagnosis, such as:  Pap test.  Blood tests.  Cultures for infection.  CT scan.  Ultrasound.  Hysteroscopy.  Laparoscopy.  MRI.  Hysterosalpingography.  D and C.  Endometrial biopsy. TREATMENT  Treatment will depend on the cause of the dysfunctional uterine bleeding (DUB). Treatment may include:  Observing your menstrual periods for a couple of months.  Prescribing medicines for medical problems, including:  Antibiotics.  Hormones.  Birth control pills.  Removing an IUD (intrauterine device, birth control).  Surgery:  D and C (scrape and remove tissue from inside the uterus).  Laparoscopy (examine inside the abdomen with a lighted tube).  Uterine ablation (destroy lining of the uterus with electrical current, laser, heat, or freezing).  Hysteroscopy (examine cervix and uterus with a lighted tube).  Hysterectomy (remove the uterus). HOME CARE INSTRUCTIONS   If medicines were prescribed, take exactly as directed. Do not change or switch medicines without consulting your caregiver.  Long term heavy bleeding may result in iron deficiency. Your caregiver may have prescribed iron pills. They help replace the iron that your body lost from heavy bleeding. Take exactly as directed.  Do not take aspirin or medicines that contain aspirin one week before or during your menstrual period. Aspirin may make the bleeding worse.  If you need to change your sanitary pad or tampon more than once every 2 hours, stay in bed with your feet elevated and a cold pack on your lower abdomen. Rest as much as possible, until the bleeding stops or slows down.  Eat well-balanced meals. Eat foods high in iron. Examples   are:  Leafy green vegetables.  Whole-grain breads and cereals.  Eggs.  Meat.  Liver.  Do not try to lose weight until the abnormal bleeding has stopped and your blood iron level is  back to normal. Do not lift more than ten pounds or do strenuous activities when you are bleeding.  For a couple of months, make note on your calendar, marking the start and ending of your period, and the type of bleeding (light, medium, heavy, spotting, clots or missed periods). This is for your caregiver to better evaluate your problem. SEEK MEDICAL CARE IF:   You develop nausea (feeling sick to your stomach) and vomiting, dizziness, or diarrhea while you are taking your medicine.  You are getting lightheaded or weak.  You have any problems that may be related to the medicine you are taking.  You develop pain with your DUB.  You want to remove your IUD.  You want to stop or change your birth control pills or hormones.  You have any type of abnormal bleeding mentioned above.  You are over 16 years old and have not had a menstrual period yet.  You are 29 years old and you are still having menstrual periods.  You have any of the symptoms mentioned above.  You develop a rash. SEEK IMMEDIATE MEDICAL CARE IF:   An oral temperature above 102 F (38.9 C) develops.  You develop chills.  You are changing your sanitary pad or tampon more than once an hour.  You develop abdominal pain.  You pass out or faint. Document Released: 06/25/2000 Document Revised: 09/20/2011 Document Reviewed: 05/27/2009 [employer] Patient Information 2015 [employer]. This information is not intended to replace advice given to you by your health care provider. Make sure you discuss any questions you have with your health care provider.  

## 2015-02-21 NOTE — Progress Notes (Signed)
Patient ID: Virginia Davis, female   DOB: September 04, 1985, 29 y.o.   MRN: [id number]  Chief Complaint  Patient presents with  . Vaginal Bleeding    HPI Virginia Davis is a 29 y.o. female.  [id number] Patient's last menstrual period was 10/11/2014 (approximate).'H/O DUB, PCOS and infertility who has seen Dr Elesa Hacker but did not do ovulation induction as planned  HPI  Past Medical History  Diagnosis Date  . Asthma   . Diabetes mellitus without complication     not taking metformin anymore, hasn't seen MD for this since 2014    Past Surgical History  Procedure Laterality Date  . No past surgeries      Family History  Problem Relation Age of Onset  . Gestational diabetes Mother     Social History Social History  Substance Use Topics  . Smoking status: Never Smoker   . Smokeless tobacco: None  . Alcohol Use: No    Allergies  Allergen Reactions  . Food     pistacho  . Penicillins   . Shellfish Allergy     Current Outpatient Prescriptions  Medication Sig Dispense Refill  . ALBUTEROL SULFATE HFA IN Inhale into the lungs as needed.    Marland Kitchen ibuprofen (ADVIL,MOTRIN) 600 MG tablet Take 1 tablet (600 mg total) by mouth every 6 (six) hours as needed. 30 tablet 0  . ibuprofen (ADVIL,MOTRIN) 800 MG tablet Take 1 tablet (800 mg total) by mouth 3 (three) times daily. 21 tablet 0  . megestrol (MEGACE) 40 MG tablet Take two tablets (80 mg) 3 times/day for 3 days, then take 2 tablets (80 mg) 2 times/day for 3 days, then take 2 tablets (40 mg) once/day 40 tablet 0  . norethindrone-ethinyl estradiol (OVCON-50) 1-50 MG-MCG tablet Take 1 tablet by mouth daily. 1 Package 4   No current facility-administered medications for this visit.    Review of Systems Review of Systems  Constitutional: Negative.   Genitourinary: Positive for menstrual problem and pelvic pain (sharp left side to LLQ).    Blood pressure 132/74, pulse 82, temperature 98.6 F (37 C), temperature source Oral, height 5'  5" (1.651 m), weight 333 lb 4.8 oz (151.184 kg), last menstrual period 10/11/2014.  Physical Exam Physical Exam  Constitutional: She is oriented to person, place, and time. She appears well-developed. No distress.  obese  Cardiovascular: Normal rate.   Pulmonary/Chest: Effort normal. No respiratory distress.  Abdominal: Soft. There is no tenderness. There is no rebound.  Neurological: She is alert and oriented to person, place, and time.  Skin: Skin is warm and dry.  Psychiatric: She has a normal mood and affect. Her behavior is normal.    Data Reviewed CLINICAL DATA: Menometrorrhagia x 3-4 months, PCOS  EXAM: TRANSABDOMINAL AND TRANSVAGINAL ULTRASOUND OF PELVIS  TECHNIQUE: Both transabdominal and transvaginal ultrasound examinations of the pelvis were performed. Transabdominal technique was performed for global imaging of the pelvis including uterus, ovaries, adnexal regions, and pelvic cul-de-sac. It was necessary to proceed with endovaginal exam following the transabdominal exam to visualize the endometrium.  COMPARISON: 04/10/2013  FINDINGS: Uterus  Measurements: 7.9 x 4.3 x 4.8 cm. No fibroids or other mass visualized.  Endometrium  Thickness: 20 mm. No focal abnormality visualized.  Right ovary  Measurements: 2.8 x 3.2 x 2.7 cm. 1.6 cm complex/hemorrhagic cyst, physiologic.  Left ovary  Measurements: 3.3 x 1.9 x 2.7 cm. Normal appearance/no adnexal mass.  Other findings  No free fluid.  IMPRESSION: Endometrial complex measures 20 mm. While this may  be physiologic, especially in a young patient, this is considered above the upper limits of normal (16 mm) in the setting of heavy bleeding.  If bleeding remains unresponsive to hormonal or medical therapy, focal lesion work-up with sonohysterogram should be considered. Endometrial biopsy should also be considered in pre-menopausal patients at high risk for endometrial  carcinoma.  (Ref: Radiological Reasoning: Algorithmic Workup of Abnormal Vaginal Bleeding with Endovaginal Sonography and Sonohysterography. AJR 2008; 161:W96-04).   Electronically Signed  By: Charline Bills M.D.  On: 02/19/2015 09:09  Assessment   DUB, probably PCOS,, wants cycle control for now. Bleeding stopped on Megace   20 mm endometrium on [place]  Plan    Ovcon 1/50 start after Megace complete RTC 3 months        ARNOLD,JAMES 02/21/2015, 10:12 AM

## 2015-02-24 ENCOUNTER — Telehealth: Payer: Self-pay | Admitting: *Deleted

## 2015-02-24 MED ORDER — NORGESTIMATE-ETH ESTRADIOL 0.25-35 MG-MCG PO TABS
1.0000 | ORAL_TABLET | Freq: Every day | ORAL | Status: DC
Start: 2015-02-24 — End: 2015-05-07

## 2015-02-24 NOTE — Telephone Encounter (Signed)
Called pt and LM that we are returning her call to please give [place] a call back to the Clinics.

## 2015-02-24 NOTE — Telephone Encounter (Signed)
Pt returned call and informed me that she was prescribed a discontinued BCP.  I advised that we will prescribe another BCP that is within date and to go pick up from her [place] pharmacy.  Pt stated understanding with no further questions. Sprintec e-prescribed.

## 2015-02-24 NOTE — Telephone Encounter (Signed)
Braleigh left a voicemail message this am stating she was here on Friday and was prescribed a pill and she went to the pharmacy to get it and they told her it was discontinued. States she needs something else prescribed . States she is at work and if she doesn't answer , we may leave a message.

## 2015-04-05 ENCOUNTER — Encounter (HOSPITAL_COMMUNITY): Payer: Self-pay | Admitting: Oncology

## 2015-04-05 ENCOUNTER — Emergency Department (HOSPITAL_COMMUNITY)
Admission: EM | Admit: 2015-04-05 | Discharge: 2015-04-06 | Disposition: A | Discharge status: Home / Self Care | Payer: Medicaid Other | Attending: Emergency Medicine | Admitting: Emergency Medicine

## 2015-04-05 DIAGNOSIS — Z79899 Other long term (current) drug therapy: Secondary | ICD-10-CM | POA: Diagnosis not present

## 2015-04-05 DIAGNOSIS — Z3202 Encounter for pregnancy test, result negative: Secondary | ICD-10-CM | POA: Insufficient documentation

## 2015-04-05 DIAGNOSIS — N938 Other specified abnormal uterine and vaginal bleeding: Secondary | ICD-10-CM | POA: Diagnosis not present

## 2015-04-05 DIAGNOSIS — J45909 Unspecified asthma, uncomplicated: Secondary | ICD-10-CM | POA: Diagnosis not present

## 2015-04-05 DIAGNOSIS — Z880 Allergy status to penicillin: Secondary | ICD-10-CM | POA: Insufficient documentation

## 2015-04-05 DIAGNOSIS — E119 Type 2 diabetes mellitus without complications: Secondary | ICD-10-CM | POA: Insufficient documentation

## 2015-04-05 DIAGNOSIS — E669 Obesity, unspecified: Secondary | ICD-10-CM | POA: Insufficient documentation

## 2015-04-05 DIAGNOSIS — M545 Low back pain: Secondary | ICD-10-CM | POA: Diagnosis not present

## 2015-04-05 DIAGNOSIS — N939 Abnormal uterine and vaginal bleeding, unspecified: Secondary | ICD-10-CM | POA: Diagnosis present

## 2015-04-05 NOTE — ED Notes (Signed)
Per pt she had been bleeding since April of this year.  Last month pt began birth control.  Pt presents d/t increased vaginal bleeding w/ clots.  Bleeding intensified at 1800

## 2015-04-06 LAB — BASIC METABOLIC PANEL
ANION GAP: 9 (ref 5–15)
BUN: 9 mg/dL (ref 6–20)
CO2: 24 mmol/L (ref 22–32)
Calcium: 8.8 mg/dL — ABNORMAL LOW (ref 8.9–10.3)
Chloride: 104 mmol/L (ref 101–111)
Creatinine, Ser: 0.65 mg/dL (ref 0.44–1.00)
Glucose, Bld: 103 mg/dL — ABNORMAL HIGH (ref 65–99)
POTASSIUM: 3.5 mmol/L (ref 3.5–5.1)
SODIUM: 137 mmol/L (ref 135–145)

## 2015-04-06 LAB — CBC WITH DIFFERENTIAL/PLATELET
BASOS ABS: 0 10*3/uL (ref 0.0–0.1)
BASOS PCT: 0 %
EOS ABS: 0.2 10*3/uL (ref 0.0–0.7)
EOS PCT: 2 %
HCT: 35.1 % — ABNORMAL LOW (ref 36.0–46.0)
Hemoglobin: 11.2 g/dL — ABNORMAL LOW (ref 12.0–15.0)
Lymphocytes Relative: 39 %
Lymphs Abs: 3.6 10*3/uL (ref 0.7–4.0)
MCH: 26.9 pg (ref 26.0–34.0)
MCHC: 31.9 g/dL (ref 30.0–36.0)
MCV: 84.4 fL (ref 78.0–100.0)
Monocytes Absolute: 0.6 10*3/uL (ref 0.1–1.0)
Monocytes Relative: 6 %
Neutro Abs: 4.8 10*3/uL (ref 1.7–7.7)
Neutrophils Relative %: 53 %
PLATELETS: 281 10*3/uL (ref 150–400)
RBC: 4.16 MIL/uL (ref 3.87–5.11)
RDW: 14.6 % (ref 11.5–15.5)
WBC: 9.2 10*3/uL (ref 4.0–10.5)

## 2015-04-06 LAB — URINALYSIS, ROUTINE W REFLEX MICROSCOPIC
Bilirubin Urine: NEGATIVE
Glucose, UA: NEGATIVE mg/dL
KETONES UR: NEGATIVE mg/dL
LEUKOCYTES UA: NEGATIVE
NITRITE: NEGATIVE
Protein, ur: NEGATIVE mg/dL
Specific Gravity, Urine: 1.024 (ref 1.005–1.030)
Urobilinogen, UA: 1 mg/dL (ref 0.0–1.0)
pH: 7 (ref 5.0–8.0)

## 2015-04-06 LAB — WET PREP, GENITAL
Clue Cells Wet Prep HPF POC: NONE SEEN
Trich, Wet Prep: NONE SEEN
Yeast Wet Prep HPF POC: NONE SEEN

## 2015-04-06 LAB — RPR: RPR Ser Ql: NONREACTIVE

## 2015-04-06 LAB — URINE MICROSCOPIC-ADD ON

## 2015-04-06 LAB — I-STAT BETA HCG BLOOD, ED (MC, WL, AP ONLY)

## 2015-04-06 LAB — HIV ANTIBODY (ROUTINE TESTING W REFLEX): HIV SCREEN 4TH GENERATION: NONREACTIVE

## 2015-04-06 MED ORDER — NAPROXEN 500 MG PO TABS: ORAL_TABLET | ORAL | Status: DC

## 2015-04-06 MED ORDER — KETOROLAC TROMETHAMINE 60 MG/2ML IM SOLN
60.0000 mg | Freq: Once | INTRAMUSCULAR | Status: AC
  Administered 2015-04-06: 60 mg via INTRAMUSCULAR
  Filled 2015-04-06: qty 2

## 2015-04-06 NOTE — ED Provider Notes (Signed)
CSN: [id number]     Arrival date & time 04/05/15  2152 History  This chart was scribed for Devoria Albe, MD by Doreatha Martin, ED Scribe. This patient was seen in room WA15/WA15 and the patient's care was started at 1:33 AM.     Chief Complaint  Patient presents with  . Vaginal Bleeding   The history is provided by the patient. No language interpreter was used.    HPI Comments: Virginia Davis is a 29 y.o. female with hx of pre-diabetes, asthma who presents to the Emergency Department complaining of vaginal bleeding onset 10 days ago and worsened tonight with associated sharp LLQ pain, lower back pain. Pt reports that she has been spotting for 10 days and went to the bathroom at [facility] and experienced sudden heavy bleeding with an unusual white clot (see picture). She notes that using the restroom worsens her abdominal pain. Pt states she has never had normal periods since onset of her menses. She states she previously bled from 2010 to 2014. She notes that she most recently began to bleed 5 months ago and was seen at [facility] and started medication that stopped the bleeding. She was then started on birth control a month ago and finished her first pack yesterday. Pt states she started a new pack this morning. States that she started having bleeding with her placebo pills 3 days ago and states her bleeding was light and then stopped. She took the first pill today and then about 6 PM had severe lower abdominal pain in the left lower quadrant with some diarrhea and started having heavy bleeding again with clotting. She had vomiting once. She states she had ultrasound a month ago through the women's clinic. Patient denies any chance of being pregnant because she has a female partner.. No hx of pregnancy, no concern for pregnancy. She works at a daycare. She had an ultrasound a week ago with no acute findings. Pt is allergic to Penicillin.   PCP is with [facility] is with  Women's clinic at [facility]   Past Medical History  Diagnosis Date  . Asthma   . Diabetes mellitus without complication     not taking metformin anymore, hasn't seen MD for this since 2014   Past Surgical History  Procedure Laterality Date  . No past surgeries     Family History  Problem Relation Age of Onset  . Gestational diabetes Mother    Social History  Substance Use Topics  . Smoking status: Never Smoker   . Smokeless tobacco: Never Used  . Alcohol Use: No   employed  OB History    Gravida Para Term Preterm AB TAB SAB Ectopic Multiple Living       Review of Systems  Gastrointestinal: Positive for abdominal pain.  Genitourinary: Positive for vaginal bleeding.  Musculoskeletal: Positive for back pain.  All other systems reviewed and are negative.  Allergies  Food; Penicillins; and Shellfish allergy  Home Medications   Prior to Admission medications   Medication Sig Start Date End Date Taking? Authorizing Provider  norgestimate-ethinyl estradiol (ORTHO-CYCLEN,SPRINTEC,PREVIFEM) 0.25-35 MG-MCG tablet Take 1 tablet by mouth daily. 02/24/15  Yes Adam Phenix, MD  naproxen (NAPROSYN) 500 MG tablet Take 1 po BID with food prn pain 04/06/15   Devoria Albe, MD   BP 174/82 mmHg  Pulse 73  Temp(Src) 98.3 F (36.8 C) (Oral)  Resp 20  Ht   (1.651 m)  Wt 333 lb (151.048 kg)  BMI 55.41 kg/m2  SpO2 100%  LMP 04/02/2015  Vital signs normal   Physical Exam  Constitutional: She is oriented to person, place, and time. She appears well-developed and well-nourished.  Non-toxic appearance. She does not appear ill. No distress.  Obese and has facial hair.   HENT:  Head: Normocephalic and atraumatic.  Right Ear: External ear normal.  Left Ear: External ear normal.  Nose: Nose normal. No mucosal edema or rhinorrhea.  Mouth/Throat: Oropharynx is clear and moist and mucous membranes are normal. No dental abscesses or uvula swelling.  Eyes:  Conjunctivae and EOM are normal. Pupils are equal, round, and reactive to light.  Neck: Normal range of motion and full passive range of motion without pain. Neck supple.  Cardiovascular: Normal rate, regular rhythm and normal heart sounds.  Exam reveals no gallop and no friction rub.   No murmur heard. Pulmonary/Chest: Effort normal and breath sounds normal. No respiratory distress. She has no wheezes. She has no rhonchi. She has no rales. She exhibits no tenderness and no crepitus.  Abdominal: Soft. Normal appearance and bowel sounds are normal. She exhibits no distension. There is no tenderness. There is no rebound and no guarding.  Genitourinary:  Some dark blood in the vault. Unable to assess uterine size due to thickness of abdominal wall, but it is non tender. Non tender over the right adnexa. Has some tenderness of the left adnexa, again unable to assess if enlarged.   Musculoskeletal: Normal range of motion. She exhibits no edema or tenderness.  Moves all extremities well.   Neurological: She is alert and oriented to person, place, and time. She has normal strength. No cranial nerve deficit.  Skin: Skin is warm, dry and intact. No rash noted. No erythema. No pallor.  Psychiatric: She has a normal mood and affect. Her speech is normal and behavior is normal. Her mood appears not anxious.  Nursing note and vitals reviewed.       ED Course  Procedures (including critical care time)  Medications  ketorolac (TORADOL) injection 60 mg (60 mg Intramuscular Given 04/06/15 0204)   DIAGNOSTIC STUDIES: Oxygen Saturation is 100% on RA, normal by my interpretation.    COORDINATION OF CARE: 1:44 AM Discussed treatment plan with pt at bedside and pt agreed to plan.   Recheck at time of discharge. Patient was given her test results. She states her pain is improved after the Toradol. She was referred back to the [facility] clinic, she can call tomorrow to get appointment to be seen. We  also discussed if she seems worse to go to the maternity admissions unit.   Labs Review Results for orders placed or performed during the hospital encounter of 04/05/15  Wet prep, genital  Result Value Ref Range   Yeast Wet Prep HPF POC NONE SEEN NONE SEEN   Trich, Wet Prep NONE SEEN NONE SEEN   Clue Cells Wet Prep HPF POC NONE SEEN NONE SEEN   WBC, Wet Prep HPF POC FEW (A) NONE SEEN  CBC with Differential  Result Value Ref Range   WBC 9.2 4.0 - 10.5 K/uL   RBC 4.16 3.87 - 5.11 MIL/uL   Hemoglobin 11.2 (L) 12.0 - 15.0 g/dL   HCT 04.5 (L) 40.9 - 81.1 %   MCV 84.4 78.0 - 100.0 fL   MCH 26.9 26.0 - 34.0 pg   MCHC 31.9 30.0 - 36.0 g/dL   RDW 91.4 78.2 -  15.5 %   Platelets 281 150 - 400 K/uL   Neutrophils Relative % 53 %   Neutro Abs 4.8 1.7 - 7.7 K/uL   Lymphocytes Relative 39 %   Lymphs Abs 3.6 0.7 - 4.0 K/uL   Monocytes Relative 6 %   Monocytes Absolute 0.6 0.1 - 1.0 K/uL   Eosinophils Relative 2 %   Eosinophils Absolute 0.2 0.0 - 0.7 K/uL   Basophils Relative 0 %   Basophils Absolute 0.0 0.0 - 0.1 K/uL  Basic metabolic panel  Result Value Ref Range   Sodium 137 135 - 145 mmol/L   Potassium 3.5 3.5 - 5.1 mmol/L   Chloride 104 101 - 111 mmol/L   CO2 24 22 - 32 mmol/L   Glucose, Bld 103 (H) 65 - 99 mg/dL   BUN 9 6 - 20 mg/dL   Creatinine, Ser 1.61 0.44 - 1.00 mg/dL   Calcium 8.8 (L) 8.9 - 10.3 mg/dL   GFR calc non Af Amer >60 >60 mL/min   GFR calc Af Amer >60 >60 mL/min   Anion gap 9 5 - 15  Urinalysis, Routine w reflex microscopic (not at [facility])  Result Value Ref Range   Color, Urine YELLOW YELLOW   APPearance CLEAR CLEAR   Specific Gravity, Urine 1.024 1.005 - 1.030   pH 7.0 5.0 - 8.0   Glucose, UA NEGATIVE NEGATIVE mg/dL   Hgb urine dipstick SMALL (A) NEGATIVE   Bilirubin Urine NEGATIVE NEGATIVE   Ketones, ur NEGATIVE NEGATIVE mg/dL   Protein, ur NEGATIVE NEGATIVE mg/dL   Urobilinogen, UA 1.0 0.0 - 1.0 mg/dL   Nitrite NEGATIVE NEGATIVE   Leukocytes, UA NEGATIVE  NEGATIVE  Urine microscopic-add on  Result Value Ref Range   Squamous Epithelial / LPF FEW (A) RARE   RBC / HPF 11-20 <3 RBC/hpf   Bacteria, UA RARE RARE   Urine-Other MUCOUS PRESENT   I-Stat beta hCG blood, ED  Result Value Ref Range   I-stat hCG, quantitative <5.0 <5 mIU/mL   Comment 3           Laboratory interpretation all normal except hematuria in voided UA, mild anemia     Imaging Review No results found. I have personally reviewed and evaluated these images and lab results as part of my medical decision-making.   EKG Interpretation None      MDM   Final diagnoses:  DUB (dysfunctional uterine bleeding)   Discharge Medication List as of 04/06/2015  4:50 AM    START taking these medications   Details  naproxen (NAPROSYN) 500 MG tablet Take 1 po BID with food prn pain, Print        Plan discharge  Devoria Albe, MD, FACEP   I personally performed the services described in this documentation, which was scribed in my presence. The recorded information has been reviewed and considered.  Devoria Albe, MD, Concha Pyo, MD 04/06/15 [id number]

## 2015-04-06 NOTE — Discharge Instructions (Signed)
Continue your birth control pills. Take the Naprosyn for your pain and it may help with your bleeding. Call your women's clinic on Monday, September 26 to let them know the problems you're having. If you seem to be getting worse instead of better go to [facility] to be seen at the maternity admissions unit.   Abnormal Uterine Bleeding Abnormal uterine bleeding can affect women at various stages in life, including teenagers, women in their reproductive years, pregnant women, and women who have reached menopause. Several kinds of uterine bleeding are considered abnormal, including:  Bleeding or spotting between periods.   Bleeding after sexual intercourse.   Bleeding that is heavier or more than normal.   Periods that last longer than usual.  Bleeding after menopause.  Many cases of abnormal uterine bleeding are minor and simple to treat, while others are more serious. Any type of abnormal bleeding should be evaluated by your health care provider. Treatment will depend on the cause of the bleeding. HOME CARE INSTRUCTIONS Monitor your condition for any changes. The following actions may help to alleviate any discomfort you are experiencing:  Avoid the use of tampons and douches as directed by your health care provider.  Change your pads frequently. You should get regular pelvic exams and Pap tests. Keep all follow-up appointments for diagnostic tests as directed by your health care provider.  SEEK MEDICAL CARE IF:   Your bleeding lasts more than 1 week.   You feel dizzy at times.  SEEK IMMEDIATE MEDICAL CARE IF:   You pass out.   You are changing pads every 15 to 30 minutes.   You have abdominal pain.  You have a fever.   You become sweaty or weak.   You are passing large blood clots from the vagina.   You start to feel nauseous and vomit. MAKE SURE YOU:   Understand these instructions.  Will watch your condition.  Will get help right away if you are not  doing well or get worse. Document Released: 06/28/2005 Document Revised: 07/03/2013 Document Reviewed: 01/25/2013 [facility] Patient Information 2015 [place], [place]. This information is not intended to replace advice given to you by your health care provider. Make sure you discuss any questions you have with your health care provider.

## 2015-04-07 ENCOUNTER — Inpatient Hospital Stay (HOSPITAL_COMMUNITY)
Admission: AD | Admit: 2015-04-07 | Discharge: 2015-04-07 | Disposition: A | Discharge status: Home / Self Care | Payer: Medicaid Other | Source: Ambulatory Visit | Attending: Family Medicine | Admitting: Family Medicine

## 2015-04-07 ENCOUNTER — Encounter (HOSPITAL_COMMUNITY): Payer: Self-pay | Admitting: *Deleted

## 2015-04-07 DIAGNOSIS — N939 Abnormal uterine and vaginal bleeding, unspecified: Secondary | ICD-10-CM | POA: Diagnosis not present

## 2015-04-07 DIAGNOSIS — J45909 Unspecified asthma, uncomplicated: Secondary | ICD-10-CM | POA: Insufficient documentation

## 2015-04-07 DIAGNOSIS — D500 Iron deficiency anemia secondary to blood loss (chronic): Secondary | ICD-10-CM | POA: Diagnosis not present

## 2015-04-07 DIAGNOSIS — E119 Type 2 diabetes mellitus without complications: Secondary | ICD-10-CM | POA: Diagnosis not present

## 2015-04-07 DIAGNOSIS — R109 Unspecified abdominal pain: Secondary | ICD-10-CM | POA: Diagnosis present

## 2015-04-07 DIAGNOSIS — I10 Essential (primary) hypertension: Secondary | ICD-10-CM | POA: Diagnosis not present

## 2015-04-07 DIAGNOSIS — R110 Nausea: Secondary | ICD-10-CM | POA: Diagnosis present

## 2015-04-07 HISTORY — DX: Essential (primary) hypertension: I10

## 2015-04-07 LAB — CBC WITH DIFFERENTIAL/PLATELET
BASOS ABS: 0 10*3/uL (ref 0.0–0.1)
BASOS PCT: 0 %
Eosinophils Absolute: 0.1 10*3/uL (ref 0.0–0.7)
Eosinophils Relative: 2 %
HCT: 34.8 % — ABNORMAL LOW (ref 36.0–46.0)
HEMOGLOBIN: 11 g/dL — AB (ref 12.0–15.0)
LYMPHS ABS: 2.6 10*3/uL (ref 0.7–4.0)
Lymphocytes Relative: 36 %
MCH: 26.6 pg (ref 26.0–34.0)
MCHC: 31.6 g/dL (ref 30.0–36.0)
MCV: 84.1 fL (ref 78.0–100.0)
Monocytes Absolute: 0.5 10*3/uL (ref 0.1–1.0)
Monocytes Relative: 7 %
NEUTROS PCT: 55 %
Neutro Abs: 4.1 10*3/uL (ref 1.7–7.7)
PLATELETS: 277 10*3/uL (ref 150–400)
RBC: 4.14 MIL/uL (ref 3.87–5.11)
RDW: 14.9 % (ref 11.5–15.5)
WBC: 7.4 10*3/uL (ref 4.0–10.5)

## 2015-04-07 LAB — GC/CHLAMYDIA PROBE AMP (~~LOC~~) NOT AT ARMC
CHLAMYDIA, DNA PROBE: NEGATIVE
NEISSERIA GONORRHEA: NEGATIVE

## 2015-04-07 MED ORDER — TRAMADOL HCL 50 MG PO TABS: 50.0000 mg | ORAL_TABLET | Freq: Four times a day (QID) | ORAL | Status: DC | PRN

## 2015-04-07 MED ORDER — HYDROCODONE-ACETAMINOPHEN 5-325 MG PO TABS
2.0000 | ORAL_TABLET | Freq: Once | ORAL | Status: AC
  Administered 2015-04-07: 2 via ORAL
  Filled 2015-04-07: qty 2

## 2015-04-07 MED ORDER — IBUPROFEN 600 MG PO TABS
600.0000 mg | ORAL_TABLET | Freq: Once | ORAL | Status: AC
  Administered 2015-04-07: 600 mg via ORAL
  Filled 2015-04-07: qty 1

## 2015-04-07 MED ORDER — MEDROXYPROGESTERONE ACETATE 10 MG PO TABS: 10.0000 mg | ORAL_TABLET | Freq: Every day | ORAL | Status: DC

## 2015-04-07 MED ORDER — IBUPROFEN 800 MG PO TABS: 800.0000 mg | ORAL_TABLET | Freq: Three times a day (TID) | ORAL | Status: DC

## 2015-04-07 NOTE — Consult Note (Signed)
Urine in lab 

## 2015-04-07 NOTE — MAU Note (Signed)
abd pain started on Sat.  Period started last Wed.  Started pill pack on Sat, later that day. Bleeding increased.  Had vomiting and diarrhea- they stopped that night.  Still nausea.  Passed ? Tissue like substance - took a picture.

## 2015-04-07 NOTE — MAU Provider Note (Signed)
History     CSN: [id number]  Arrival date and time: 04/07/15 4098   First Provider Initiated Contact with Patient 04/07/15 1029      Chief Complaint  Patient presents with  . Abdominal Pain  . Nausea   HPI   Ms.Virginia Davis is a 29 y.o. female [id number] with a history of PCOS and irregular menstrual cycles presenting to MAU with abdominal pain and vaginal bleeding. She was seen yesterday at [facility] with the same symptoms and she was given pain medication there which helped some. She was instructed to come to MAU if she continued to have symptoms. The pain is cramp like pain located in the lower back of her stomach on both sides. At times the cramping is mild and other times it is sharp.  LMP: 6 days ago, her cycles are very irregular and she does not have a cycle every month.  Heavy bleeding started Saturday.  She started birth control 1 month ago; this is the first pack she has been on. This was prescribed in the [facility]. She is scheduled to go back to the [facility] in November.  She started a new pill pack on Saturday.   OB History    Gravida Para Term Preterm AB TAB SAB Ectopic Multiple Living        Past Medical History  Diagnosis Date  . Asthma   . Diabetes mellitus without complication     not taking metformin anymore, hasn't seen MD for this since 2014  . Hypertension     Past Surgical History  Procedure Laterality Date  . No past surgeries      Family History  Problem Relation Age of Onset  . Gestational diabetes Mother     Social History  Substance Use Topics  . Smoking status: Never Smoker   . Smokeless tobacco: Never Used  . Alcohol Use: No    Allergies:  Allergies  Allergen Reactions  . Food     pistacho  . Penicillins     Has patient had a PCN reaction causing immediate rash, facial/tongue/throat swelling, SOB or lightheadedness with hypotension: Yes Has patient had a PCN reaction causing severe rash involving mucus  membranes or skin necrosis: No Has patient had a PCN reaction that required hospitalization No Has patient had a PCN reaction occurring within the last 10 years: No If all of the above answers are "NO", then may proceed with Cephalosporin use.     . Shellfish Allergy     Prescriptions prior to admission  Medication Sig Dispense Refill Last Dose  . naproxen (NAPROSYN) 500 MG tablet Take 1 po BID with food prn pain 30 tablet 0   . norgestimate-ethinyl estradiol (ORTHO-CYCLEN,SPRINTEC,PREVIFEM) 0.25-35 MG-MCG tablet Take 1 tablet by mouth daily. 1 Package 4 04/05/2015 at Unknown time   Results for orders placed or performed during the hospital encounter of 04/07/15 (from the past 48 hour(s))  CBC with Differential     Status: Abnormal   Collection Time: 04/07/15 10:55 AM  Result Value Ref Range   WBC 7.4 4.0 - 10.5 K/uL   RBC 4.14 3.87 - 5.11 MIL/uL   Hemoglobin 11.0 (L) 12.0 - 15.0 g/dL   HCT 11.9 (L) 14.7 - 82.9 %   MCV 84.1 78.0 - 100.0 fL   MCH 26.6 26.0 - 34.0 pg   MCHC 31.6 30.0 - 36.0 g/dL   RDW 56.2 13.0 - 86.5 %   Platelets  277 150 - 400 K/uL   Neutrophils Relative % 55 %   Neutro Abs 4.1 1.7 - 7.7 K/uL   Lymphocytes Relative 36 %   Lymphs Abs 2.6 0.7 - 4.0 K/uL   Monocytes Relative 7 %   Monocytes Absolute 0.5 0.1 - 1.0 K/uL   Eosinophils Relative 2 %   Eosinophils Absolute 0.1 0.0 - 0.7 K/uL   Basophils Relative 0 %   Basophils Absolute 0.0 0.0 - 0.1 K/uL    Review of Systems  Constitutional: Negative for fever and chills.  Gastrointestinal: Positive for abdominal pain (Lower left abdominal pain ) and diarrhea. Negative for constipation.   Physical Exam   Blood pressure 124/67, pulse 79, temperature 98.7 F (37.1 C), temperature source Oral, resp. rate 20, height 5' 4.5" (1.638 m), weight 148.961 kg (328 lb 6.4 oz), last menstrual period 04/02/2015.  Physical Exam  Constitutional: She is oriented to person, place, and time. She appears well-developed and  well-nourished. No distress.  Obese female   HENT:  Head: Normocephalic.  Eyes: Pupils are equal, round, and reactive to light.  Neck: Neck supple.  Respiratory: Effort normal.  GI: Soft. There is generalized tenderness.  Genitourinary:  Speculum exam: Vagina - Small amount of dark red blood pooling in the vagina.  Cervix - + active bleeding  Bimanual exam: Cervix closed, no CMT  Chaperone present for exam.  Musculoskeletal: Normal range of motion.  Neurological: She is alert and oriented to person, place, and time.  Skin: Skin is warm. She is not diaphoretic.  Psychiatric: Her behavior is normal.    MAU Course  Procedures  None  MDM  Ibuprofen 600 mg given PO Patient down to a 3/10 from 8/10. Prior to discharge the patient says her pain is starting to return. Vicodin 2 tabs given prior to DC.   Consulted with Dr. Adrian Blackwater  Assessment and Plan   A:  1. Abnormal uterine bleeding   2. Iron deficiency anemia due to chronic blood loss    P:  Discharge home in stable condition RX: Ultram, ibuprofen, Provera X 10 days Bleeding precautions Resume BC pills Return to MAU as needed, if symptoms worsen   Duane Lope, NP 04/07/2015 12:20 PM

## 2015-04-07 NOTE — Discharge Instructions (Signed)
Abnormal Uterine Bleeding Abnormal uterine bleeding can affect women at various stages in life, including teenagers, women in their reproductive years, pregnant women, and women who have reached menopause. Several kinds of uterine bleeding are considered abnormal, including:  Bleeding or spotting between periods.   Bleeding after sexual intercourse.   Bleeding that is heavier or more than normal.   Periods that last longer than usual.  Bleeding after menopause.  Many cases of abnormal uterine bleeding are minor and simple to treat, while others are more serious. Any type of abnormal bleeding should be evaluated by your health care provider. Treatment will depend on the cause of the bleeding. HOME CARE INSTRUCTIONS Monitor your condition for any changes. The following actions may help to alleviate any discomfort you are experiencing:  Avoid the use of tampons and douches as directed by your health care provider.  Change your pads frequently. You should get regular pelvic exams and Pap tests. Keep all follow-up appointments for diagnostic tests as directed by your health care provider.  SEEK MEDICAL CARE IF:   Your bleeding lasts more than 1 week.   You feel dizzy at times.  SEEK IMMEDIATE MEDICAL CARE IF:   You pass out.   You are changing pads every 15 to 30 minutes.   You have abdominal pain.  You have a fever.   You become sweaty or weak.   You are passing large blood clots from the vagina.   You start to feel nauseous and vomit. MAKE SURE YOU:   Understand these instructions.  Will watch your condition.  Will get help right away if you are not doing well or get worse. Document Released: 06/28/2005 Document Revised: 07/03/2013 Document Reviewed: 01/25/2013 [employer] Patient Information 2015 [employer]. This information is not intended to replace advice given to you by your health care provider. Make sure you discuss any questions you have with your  health care provider.  

## 2015-04-26 ENCOUNTER — Emergency Department (HOSPITAL_COMMUNITY)
Admission: EM | Admit: 2015-04-26 | Discharge: 2015-04-26 | Disposition: A | Discharge status: Home / Self Care | Payer: Medicaid Other | Attending: Emergency Medicine | Admitting: Emergency Medicine

## 2015-04-26 ENCOUNTER — Encounter (HOSPITAL_COMMUNITY): Payer: Self-pay | Admitting: Emergency Medicine

## 2015-04-26 ENCOUNTER — Emergency Department (HOSPITAL_COMMUNITY): Payer: Medicaid Other

## 2015-04-26 DIAGNOSIS — E119 Type 2 diabetes mellitus without complications: Secondary | ICD-10-CM | POA: Diagnosis not present

## 2015-04-26 DIAGNOSIS — Z793 Long term (current) use of hormonal contraceptives: Secondary | ICD-10-CM | POA: Diagnosis not present

## 2015-04-26 DIAGNOSIS — Z3202 Encounter for pregnancy test, result negative: Secondary | ICD-10-CM | POA: Diagnosis not present

## 2015-04-26 DIAGNOSIS — K529 Noninfective gastroenteritis and colitis, unspecified: Secondary | ICD-10-CM | POA: Diagnosis not present

## 2015-04-26 DIAGNOSIS — I10 Essential (primary) hypertension: Secondary | ICD-10-CM | POA: Diagnosis not present

## 2015-04-26 DIAGNOSIS — R1010 Upper abdominal pain, unspecified: Secondary | ICD-10-CM

## 2015-04-26 DIAGNOSIS — R109 Unspecified abdominal pain: Secondary | ICD-10-CM | POA: Diagnosis present

## 2015-04-26 DIAGNOSIS — J45909 Unspecified asthma, uncomplicated: Secondary | ICD-10-CM | POA: Insufficient documentation

## 2015-04-26 DIAGNOSIS — Z880 Allergy status to penicillin: Secondary | ICD-10-CM | POA: Insufficient documentation

## 2015-04-26 LAB — CBC WITH DIFFERENTIAL/PLATELET
Basophils Absolute: 0 10*3/uL (ref 0.0–0.1)
Basophils Relative: 0 %
Eosinophils Absolute: 0.1 10*3/uL (ref 0.0–0.7)
Eosinophils Relative: 1 %
HCT: 39.3 % (ref 36.0–46.0)
Hemoglobin: 12.6 g/dL (ref 12.0–15.0)
Lymphocytes Relative: 27 %
Lymphs Abs: 2.6 10*3/uL (ref 0.7–4.0)
MCH: 26.8 pg (ref 26.0–34.0)
MCHC: 32.1 g/dL (ref 30.0–36.0)
MCV: 83.4 fL (ref 78.0–100.0)
Monocytes Absolute: 0.4 10*3/uL (ref 0.1–1.0)
Monocytes Relative: 4 %
Neutro Abs: 6.6 10*3/uL (ref 1.7–7.7)
Neutrophils Relative %: 68 %
Platelets: 338 10*3/uL (ref 150–400)
RBC: 4.71 MIL/uL (ref 3.87–5.11)
RDW: 15 % (ref 11.5–15.5)
WBC: 9.7 10*3/uL (ref 4.0–10.5)

## 2015-04-26 LAB — COMPREHENSIVE METABOLIC PANEL
ALT: 18 U/L (ref 14–54)
AST: 21 U/L (ref 15–41)
Albumin: 3.2 g/dL — ABNORMAL LOW (ref 3.5–5.0)
Alkaline Phosphatase: 65 U/L (ref 38–126)
Anion gap: 11 (ref 5–15)
BUN: 9 mg/dL (ref 6–20)
CO2: 20 mmol/L — ABNORMAL LOW (ref 22–32)
Calcium: 8.9 mg/dL (ref 8.9–10.3)
Chloride: 102 mmol/L (ref 101–111)
Creatinine, Ser: 0.77 mg/dL (ref 0.44–1.00)
GFR calc Af Amer: >60 mL/min (ref >60)
GFR calc non Af Amer: >60 mL/min (ref >60)
Glucose, Bld: 113 mg/dL — ABNORMAL HIGH (ref 65–99)
Potassium: 3.8 mmol/L (ref 3.5–5.1)
Sodium: 133 mmol/L — ABNORMAL LOW (ref 135–145)
Total Bilirubin: 0.4 mg/dL (ref 0.3–1.2)
Total Protein: 6.7 g/dL (ref 6.5–8.1)

## 2015-04-26 LAB — I-STAT BETA HCG BLOOD, ED (MC, WL, AP ONLY): I-stat hCG, quantitative: <5 m[IU]/mL (ref <5)

## 2015-04-26 LAB — LIPASE, BLOOD: Lipase: 20 U/L — ABNORMAL LOW (ref 22–51)

## 2015-04-26 MED ORDER — SODIUM CHLORIDE 0.9 % IV BOLUS (SEPSIS)
1000.0000 mL | Freq: Once | INTRAVENOUS | Status: AC
  Administered 2015-04-26: 1000 mL via INTRAVENOUS

## 2015-04-26 MED ORDER — FENTANYL CITRATE (PF) 100 MCG/2ML IJ SOLN
50.0000 ug | Freq: Once | INTRAMUSCULAR | Status: AC
  Administered 2015-04-26: 50 ug via INTRAVENOUS
  Filled 2015-04-26: qty 2

## 2015-04-26 MED ORDER — ONDANSETRON HCL 4 MG/2ML IJ SOLN
4.0000 mg | Freq: Once | INTRAMUSCULAR | Status: AC
  Administered 2015-04-26: 4 mg via INTRAVENOUS
  Filled 2015-04-26: qty 2

## 2015-04-26 MED ORDER — KETOROLAC TROMETHAMINE 15 MG/ML IJ SOLN
15.0000 mg | Freq: Once | INTRAMUSCULAR | Status: AC
  Administered 2015-04-26: 15 mg via INTRAVENOUS
  Filled 2015-04-26: qty 1

## 2015-04-26 MED ORDER — ONDANSETRON HCL 4 MG PO TABS: 4.0000 mg | ORAL_TABLET | Freq: Four times a day (QID) | ORAL | Status: DC

## 2015-04-26 MED ORDER — IOHEXOL 300 MG/ML  SOLN
100.0000 mL | Freq: Once | INTRAMUSCULAR | Status: AC | PRN
  Administered 2015-04-26: 100 mL via INTRAVENOUS

## 2015-04-26 NOTE — ED Notes (Addendum)
Patient given ice water. Tolerating well.

## 2015-04-26 NOTE — ED Provider Notes (Signed)
CSN: [id number]     Arrival date & time 04/26/15  8657 History   First MD Initiated Contact with Patient 04/26/15 [phone]     Chief Complaint  Patient presents with  . Abdominal Pain   HPI   29 year old female with a history of prediabetes, hypertension, asthma presents to the emergency Department complaining of abdominal pain. She reports the pain started approximately 3 PM yesterday progressed through the evening with the addition of nausea and diarrhea, and vomiting starting last night( 5 episodes of diarrhea, 2 episodes of vomiting) She reports feeling hot, denies fever, runny nose, cough, or any other upper respiratory symptoms. She reports the pain is sharp, intermittent, and "cramping". She reports the vomitus non-bloody, diarrhea is described as "watery" with no blood or mucus. Patient denies any exposure to abnormal food and drink, no recent travel, no influenza vaccine. 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 She notes she is unable to tolerate food or drink at this time due to nausea and vomiting. Patient denies headache, neck stiffness, chest pain, lotion swelling or edema, rash. No prior history of abdominal complaints. Patient is sexually active only with females.  Past Medical History  Diagnosis Date  . Asthma   . Diabetes mellitus without complication (HCC)     not taking metformin anymore, hasn't seen MD for this since 2014  . Hypertension    Past Surgical History  Procedure Laterality Date  . No past surgeries     Family History  Problem Relation Age of Onset  . Gestational diabetes Mother    Social History  Substance Use Topics  . Smoking status: Never Smoker   . Smokeless tobacco: Never Used  . Alcohol Use: No   OB History    Gravida Para Term Preterm AB TAB SAB Ectopic Multiple Living       Review of Systems  All other systems reviewed and are negative.   Allergies  Food; Penicillins; and Shellfish allergy  Home Medications   Prior to  Admission medications   Medication Sig Start Date End Date Taking? Authorizing Provider  norgestimate-ethinyl estradiol (ORTHO-CYCLEN,SPRINTEC,PREVIFEM) 0.25-35 MG-MCG tablet Take 1 tablet by mouth daily. 02/24/15  Yes Adam Phenix, MD  traMADol (ULTRAM) 50 MG tablet Take 1 tablet (50 mg total) by mouth every 6 (six) hours as needed. Patient taking differently: Take 50 mg by mouth every 6 (six) hours as needed for moderate pain or severe pain.  04/07/15  Yes Duane Lope, NP  ibuprofen (ADVIL,MOTRIN) 800 MG tablet Take 1 tablet (800 mg total) by mouth 3 (three) times daily. Patient not taking: Reported on 04/26/2015 04/07/15   Duane Lope, NP  medroxyPROGESTERone (PROVERA) 10 MG tablet Take 1 tablet (10 mg total) by mouth daily. Patient not taking: Reported on 04/26/2015 04/07/15   Duane Lope, NP  ondansetron (ZOFRAN) 4 MG tablet Take 1 tablet (4 mg total) by mouth every 6 (six) hours. 04/26/15   Andrus Sharp, PA-C   BP 124/78 mmHg  Pulse 71  Temp(Src) 98.1 F (36.7 C) (Oral)  Resp 16  SpO2 100%  LMP 04/02/2015 (Approximate)   Physical Exam  Constitutional: She is oriented to person, place, and time. She appears well-developed and well-nourished.  Morbidly obese  HENT:  Head: Normocephalic and atraumatic.  Nose: No rhinorrhea.  Mouth/Throat: Uvula is midline, oropharynx is clear and moist and mucous membranes are normal. No oropharyngeal exudate.  Eyes: Conjunctivae are normal. Pupils  are equal, round, and reactive to light. Right eye exhibits no discharge. Left eye exhibits no discharge. No scleral icterus.  Neck: Normal range of motion. No JVD present. No tracheal deviation present.  Cardiovascular: Regular rhythm, normal heart sounds and intact distal pulses.  Exam reveals no gallop and no friction rub.   No murmur heard. Pulmonary/Chest: Effort normal and breath sounds normal. No stridor. No respiratory distress. She has no wheezes. She has no rales. She exhibits no  tenderness.  Abdominal: Soft. Bowel sounds are normal. She exhibits no distension and no mass. There is tenderness. There is no rebound and no guarding.  Tenderness to palpation of the right and upper quadrants, no tenderness to mid or lower abdomen. A dramatic no signs of infection  Musculoskeletal: Normal range of motion. She exhibits no edema or tenderness.  Neurological: She is alert and oriented to person, place, and time. Coordination normal.  Skin: Skin is warm and dry. No rash noted. No erythema. No pallor.  Psychiatric: She has a normal mood and affect. Her behavior is normal. Judgment and thought content normal.  Nursing note and vitals reviewed.     ED Course  Procedures (including critical care time) Labs Review Labs Reviewed  COMPREHENSIVE METABOLIC PANEL - Abnormal; Notable for the following:    Sodium 133 (*)    CO2 20 (*)    Glucose, Bld 113 (*)    Albumin 3.2 (*)    All other components within normal limits  LIPASE, BLOOD - Abnormal; Notable for the following:    Lipase 20 (*)    All other components within normal limits  CBC WITH DIFFERENTIAL/PLATELET  I-STAT BETA HCG BLOOD, ED ([facility], [facility], [facility] ONLY)    Imaging Review No results found. I have personally reviewed and evaluated these images and lab results as part of my medical decision-making.   EKG Interpretation None      MDM   Final diagnoses:  Upper abdominal pain  Enteritis   Labs: CBC, CMP, lipase i-STAT beta hCG- no significant findings  Imaging: CT abdomen and pelvis-mild thickening of the wall of the small bowel loops in left abdomen consistent with enteritis  Consults:  Therapeutics:- Zofran, NS, Fentanyl, Toradol   Discharge Meds: Zofran   Assessment/Plan: Patient presentation most consistent with enteritis. Vital signs reassuring, laboratory data reassuring. During my initial evaluation patient was uncomfortable, but after talking with her she was visibly improved. As soon as her family  into the room she became extremely painful, tearful, abdominal exam acute. CT scan was ordered due to patient's physical exam, showing signs consistent with enteritis. Patient instructed to continue using Zofran as needed for nausea, clear fluids, monitor for new or worsening signs or symptoms return immediately if any present. She is encouraged follow up with her primary care for reevaluation.         Eyvonne MechanicJeffrey Shamel Germond, PA-C 04/28/15 1440  Geoffery Lyonsouglas Delo, MD 04/30/15 [phone]

## 2015-04-26 NOTE — ED Notes (Signed)
Pt reports she was at work and began to have new onset central abdominal pain around 3pm. Pt went home and began having vomiting and diarrhea. PT reports 5 episodes of diarrhea and 2 episodes of vomiting. Pt also reports one episode of feeling faint. Pt denies recent travel or new foods. Pt does work at a daycare.

## 2015-04-26 NOTE — Discharge Instructions (Signed)
Please read attached information. If you experience any new or worsening signs or symptoms please return to the emergency room for evaluation. Please follow-up with your primary care provider or specialist as discussed. Please use medication prescribed only as directed and discontinue taking if you have any concerning signs or symptoms.   °

## 2015-04-28 ENCOUNTER — Ambulatory Visit: Payer: Medicaid Other | Admitting: Obstetrics & Gynecology

## 2015-05-07 ENCOUNTER — Ambulatory Visit (INDEPENDENT_AMBULATORY_CARE_PROVIDER_SITE_OTHER): Payer: Medicaid Other | Admitting: Obstetrics & Gynecology

## 2015-05-07 ENCOUNTER — Encounter: Payer: Self-pay | Admitting: Obstetrics & Gynecology

## 2015-05-07 VITALS — BP 138/73 | HR 91 | Temp 98.8°F | Wt 326.1 lb

## 2015-05-07 DIAGNOSIS — N939 Abnormal uterine and vaginal bleeding, unspecified: Secondary | ICD-10-CM | POA: Diagnosis present

## 2015-05-07 MED ORDER — NORETHINDRONE-MESTRANOL 1-50 MG-MCG PO TABS: 1.0000 | ORAL_TABLET | Freq: Every day | ORAL | Status: DC

## 2015-05-07 NOTE — Patient Instructions (Signed)
Oral Contraception Use Oral contraceptive pills (OCPs) are medicines taken to prevent pregnancy. OCPs work by preventing the ovaries from releasing eggs. The hormones in OCPs also cause the cervical mucus to thicken, preventing the sperm from entering the uterus. The hormones also cause the uterine lining to become thin, not allowing a fertilized egg to attach to the inside of the uterus. OCPs are highly effective when taken exactly as prescribed. However, OCPs do not prevent sexually transmitted diseases (STDs). Safe sex practices, such as using condoms along with an OCP, can help prevent STDs. Before taking OCPs, you may have a physical exam and Pap test. Your health care provider may also order blood tests if necessary. Your health care provider will make sure you are a good candidate for oral contraception. Discuss with your health care provider the possible side effects of the OCP you may be prescribed. When starting an OCP, it can take 2 to 3 months for the body to adjust to the changes in hormone levels in your body.  HOW TO TAKE ORAL CONTRACEPTIVE PILLS Your health care provider may advise you on how to start taking the first cycle of OCPs. Otherwise, you can:   Start on day 1 of your menstrual period. You will not need any backup contraceptive protection with this start time.   Start on the first Sunday after your menstrual period or the day you get your prescription. In these cases, you will need to use backup contraceptive protection for the first week.   Start the pill at any time of your cycle. If you take the pill within 5 days of the start of your period, you are protected against pregnancy right away. In this case, you will not need a backup form of birth control. If you start at any other time of your menstrual cycle, you will need to use another form of birth control for 7 days. If your OCP is the type called a minipill, it will protect you from pregnancy after taking it for 2 days (48  hours). After you have started taking OCPs:   If you forget to take 1 pill, take it as soon as you remember. Take the next pill at the regular time.   If you miss 2 or more pills, call your health care provider because different pills have different instructions for missed doses. Use backup birth control until your next menstrual period starts.   If you use a 28-day pack that contains inactive pills and you miss 1 of the last 7 pills (pills with no hormones), it will not matter. Throw away the rest of the non-hormone pills and start a new pill pack.  No matter which day you start the OCP, you will always start a new pack on that same day of the week. Have an extra pack of OCPs and a backup contraceptive method available in case you miss some pills or lose your OCP pack.  HOME CARE INSTRUCTIONS   Do not smoke.   Always use a condom to protect against STDs. OCPs do not protect against STDs.   Use a calendar to mark your menstrual period days.   Read the information and directions that came with your OCP. Talk to your health care provider if you have questions.  SEEK MEDICAL CARE IF:   You develop nausea and vomiting.   You have abnormal vaginal discharge or bleeding.   You develop a rash.   You miss your menstrual period.   You are losing   your hair.   You need treatment for mood swings or depression.   You get dizzy when taking the OCP.   You develop acne from taking the OCP.   You become pregnant.  SEEK IMMEDIATE MEDICAL CARE IF:   You develop chest pain.   You develop shortness of breath.   You have an uncontrolled or severe headache.   You develop numbness or slurred speech.   You develop visual problems.   You develop pain, redness, and swelling in the legs.    This information is not intended to replace advice given to you by your health care provider. Make sure you discuss any questions you have with your health care provider.   Document  Released: 06/17/2011 Document Revised: 07/19/2014 Document Reviewed: 12/17/2012 Elsevier Interactive Patient Education 2016 Else[employer] Inc[employer]  

## 2015-05-07 NOTE — Progress Notes (Signed)
Subjective:     Patient ID: Virginia Davis, female   DOB: 12/07/1985, 29 y.o.   MRN: [id number]  [id number] Patient's last menstrual period was 04/02/2015 (approximate). Taking Sprintec and menses were regular but heavy and did not stop until into next pack of pills, just finished provera, only light bleeding now   Review of Systems  Constitutional: Negative.   Genitourinary: Positive for menstrual problem and pelvic pain (cramps). Negative for vaginal discharge.       Objective:   Physical Exam  Constitutional: She is oriented to person, place, and time. She appears well-developed. No distress.  Pulmonary/Chest: Effort normal.  Neurological: She is alert and oriented to person, place, and time.  Skin: No pallor.  Psychiatric: She has a normal mood and affect.       Assessment:     Menorrhagia on first 2 packs of OCP PCOS, h/o DUB     Plan:     ON 1/50, continuous for 6 weeks then cyclic RTC 3 mo   Adam PhenixJames G Arnold, MD 05/07/2015

## 2016-03-19 IMAGING — CT CT ABD-PELV W/ CM
2 of 4 series · 7 of 46 positions shown, 8 images · IV contrast (Iodine)
Comparison: None.

CLINICAL DATA: Abdominal pain starting yesterday after non,
vomiting, diarrhea

EXAM:
CT ABDOMEN AND PELVIS WITH CONTRAST
TECHNIQUE: Multidetector CT imaging of the abdomen and pelvis was performed
using the standard protocol following bolus administration of
intravenous contrast.
CONTRAST:  100mL OMNIPAQUE IOHEXOL 300 MG/ML  SOLN

[Series 201: routine, idose (2) · axial · 0.98mm/px · z∈[-482,-122]mm · 4 of 102 slices shown, 5 images]
[im 17/102  soft-tissue]
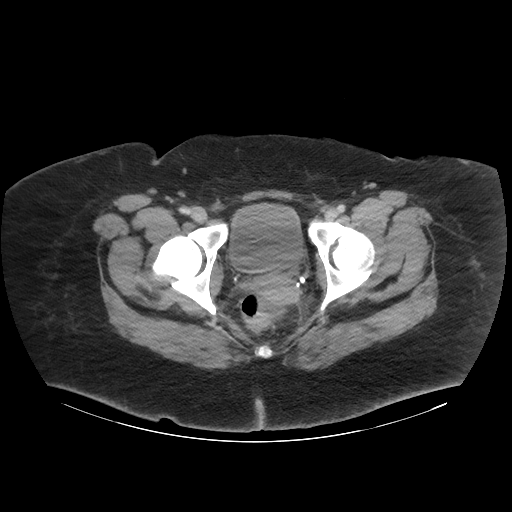
[im 17/102  bone]
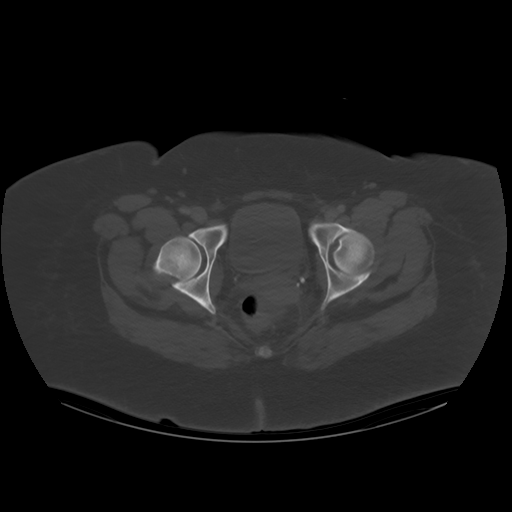
[im 41/102  soft-tissue]
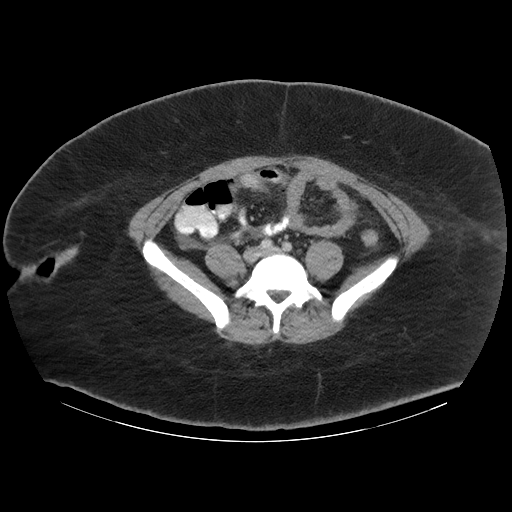
[im 65/102  soft-tissue]
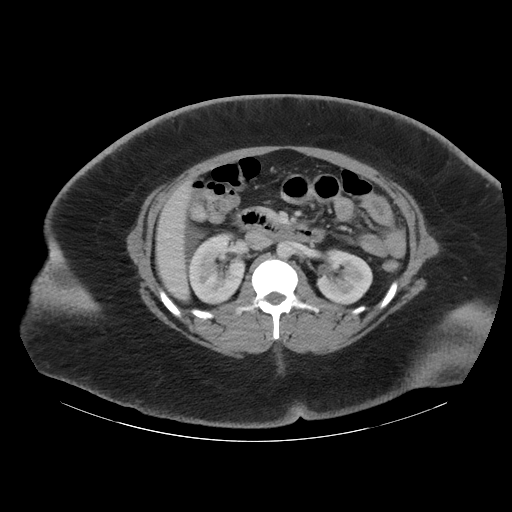
[im 89/102  soft-tissue]
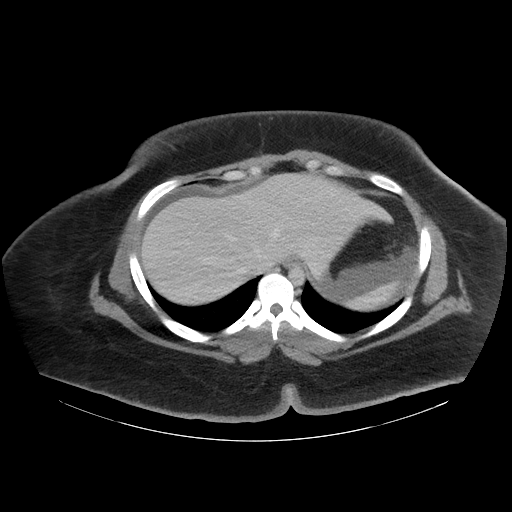

[Series 203: coronals, idose (2) · coronal · 0.45mm/px · 3 of 147 slices shown]
[im 49/147  soft-tissue]
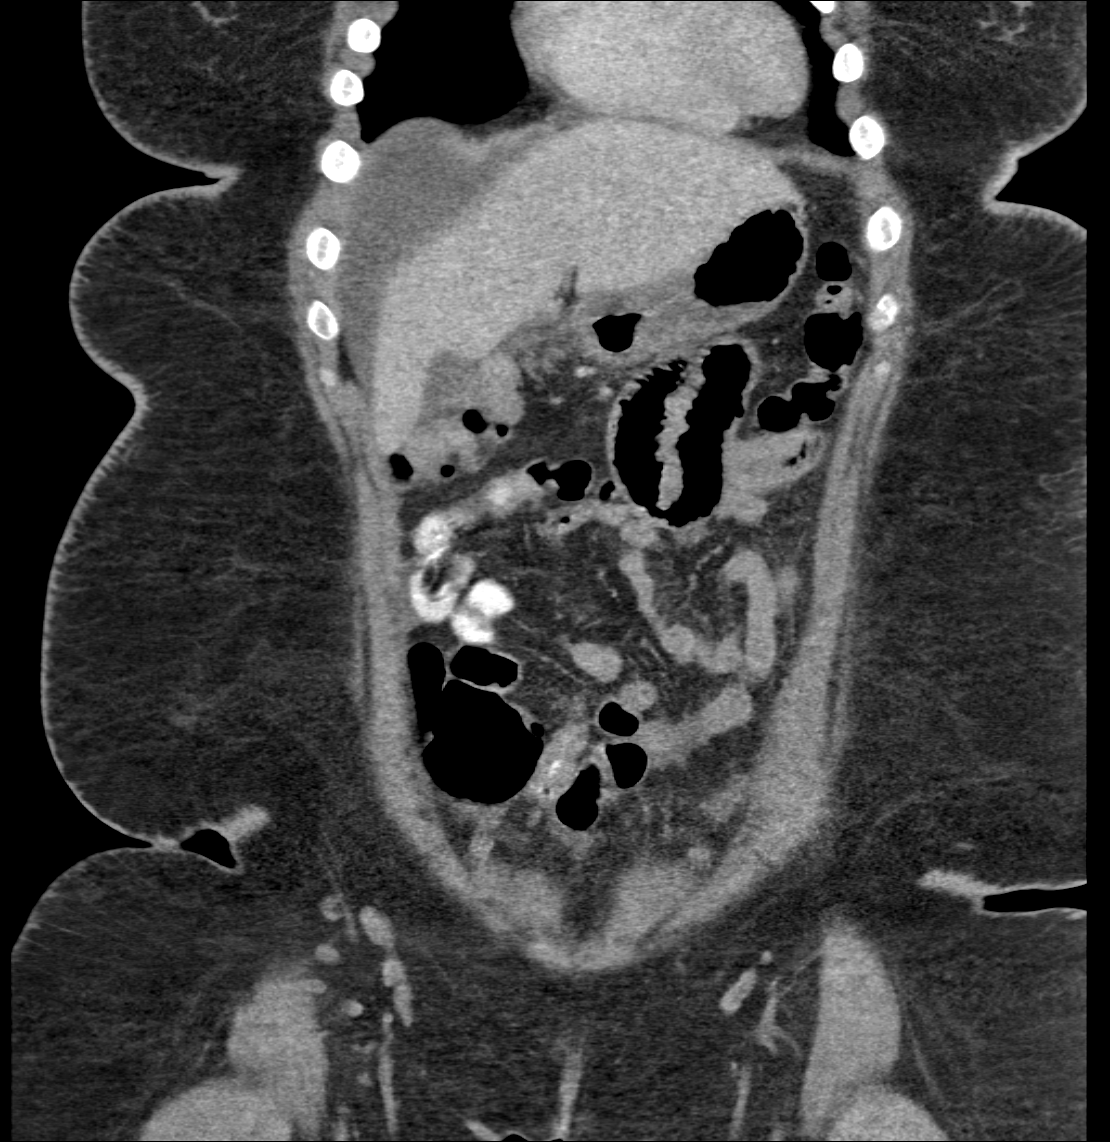
[im 65/147  soft-tissue]
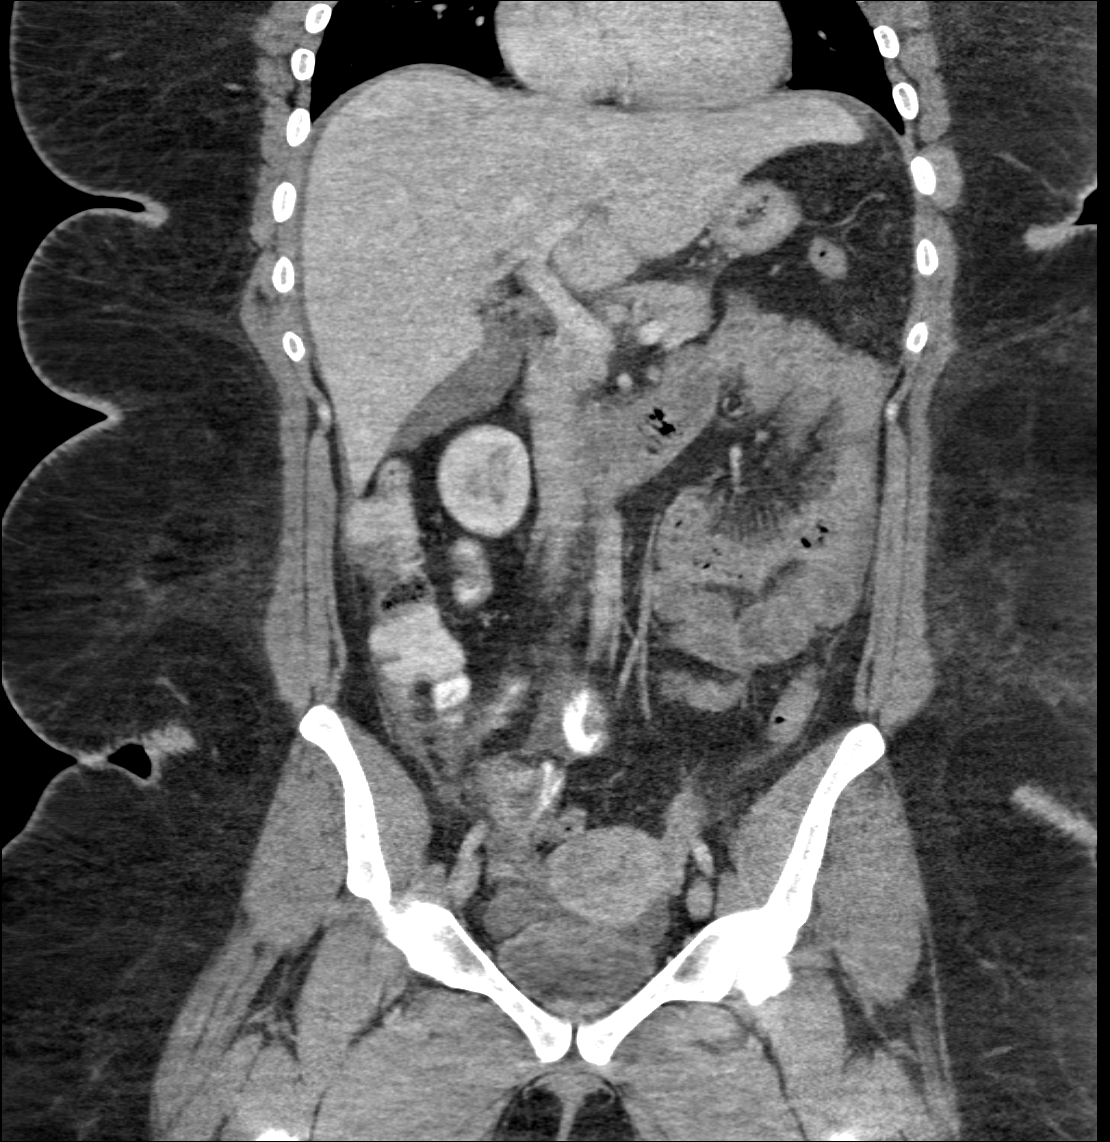
[im 82/147  soft-tissue]
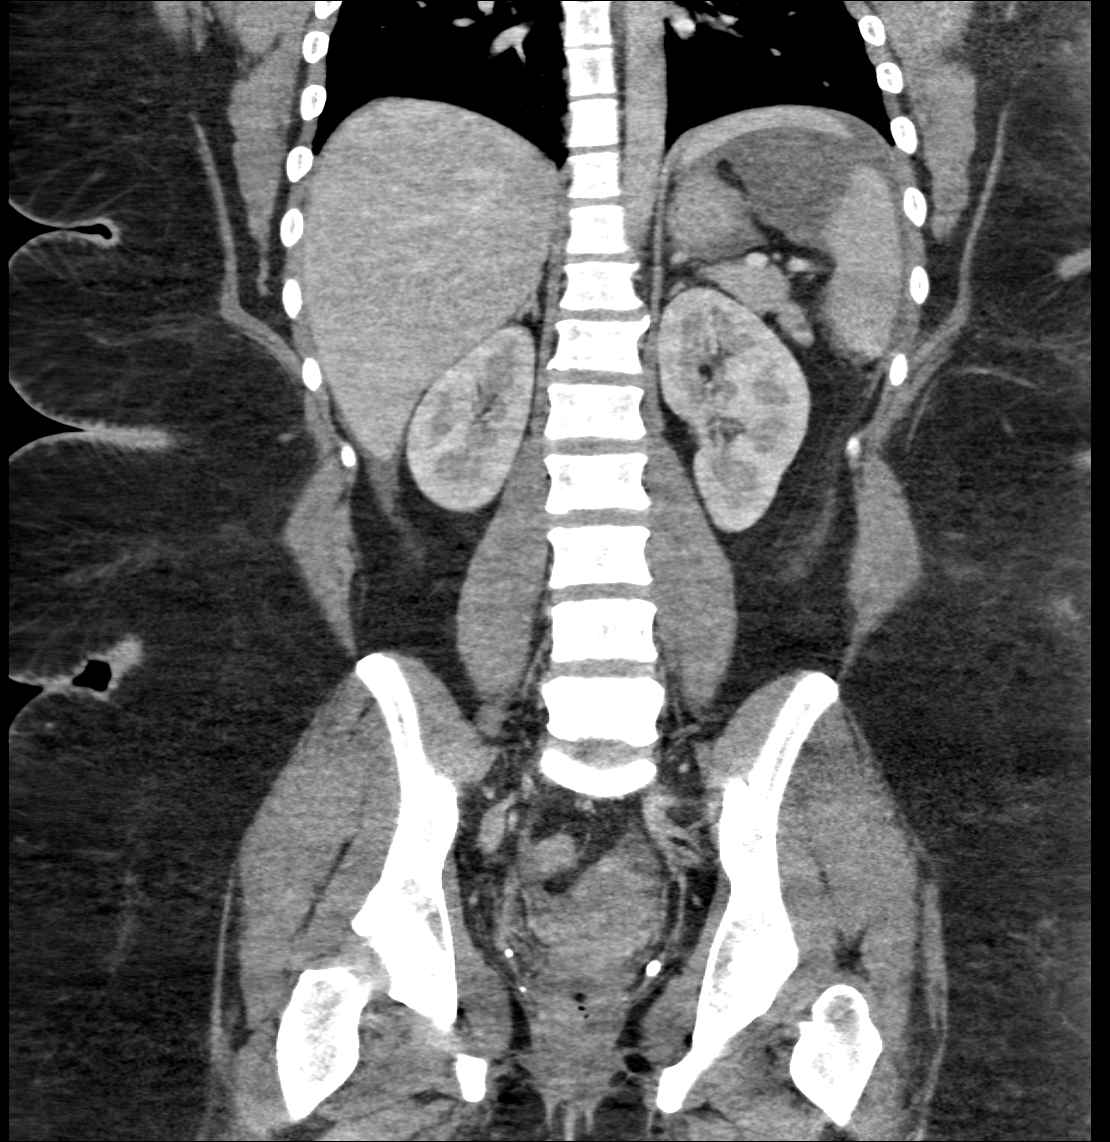

[7 of 46 positions shown; findings below may reference images not displayed]

FINDINGS: Lung bases are unremarkable. There are extensive streak artifacts
from patient's large body habitus. There is small perihepatic
ascites. Small perisplenic ascites. No focal hepatic mass. No
calcified gallstones are noted within gallbladder. The pancreas is
unremarkable. No focal splenic mass. The adrenal glands are
unremarkable. No aortic aneurysm.

Kidneys are symmetrical in size and enhancement. No hydronephrosis
or hydroureter.

No small bowel obstruction.

There is mild thickening of small bowel wall in small bowel loops in
left abdomen. Mild stranding of adjacent mesenteric fat. This is
best seen in coronal image 65. Findings are highly suspicious for
segmental enteritis. There is no evidence of small bowel
obstruction. Terminal ileum is unremarkable. Contrast material is
noted in right colon. The terminal ileum is unremarkable. Small
amount of free fluid noted adjacent to cecum. Normal appendix is
partially visualized in axial image 62. The uterus is anteflexed. No
adnexal mass. Small amount of free fluid noted within previous.
There is extensive streaky artifacts within pelvis limiting the
examination. Urinary bladder is under distended grossly
unremarkable. No definite evidence of colitis.
IMPRESSION: 1. There is mild thickening of the wall of small bowel loops in left
abdomen with subtle stranding of surrounding fat. Findings are
suspicious for segmental enteritis.
2. No small bowel obstruction.
3. No pericecal inflammation.  The terminal ileum is unremarkable.
4. Small amount of free fluid noted within pelvis. Unremarkable
anteflexed uterus.
5. No hydronephrosis or hydroureter.

## 2016-05-08 ENCOUNTER — Emergency Department (HOSPITAL_COMMUNITY)
Admission: EM | Admit: 2016-05-08 | Discharge: 2016-05-08 | Disposition: A | Discharge status: Home / Self Care | Payer: Medicaid Other | Attending: Emergency Medicine | Admitting: Emergency Medicine

## 2016-05-08 ENCOUNTER — Encounter (HOSPITAL_COMMUNITY): Payer: Self-pay | Admitting: Emergency Medicine

## 2016-05-08 DIAGNOSIS — N644 Mastodynia: Secondary | ICD-10-CM

## 2016-05-08 DIAGNOSIS — E119 Type 2 diabetes mellitus without complications: Secondary | ICD-10-CM | POA: Insufficient documentation

## 2016-05-08 DIAGNOSIS — J45909 Unspecified asthma, uncomplicated: Secondary | ICD-10-CM | POA: Insufficient documentation

## 2016-05-08 DIAGNOSIS — L089 Local infection of the skin and subcutaneous tissue, unspecified: Secondary | ICD-10-CM | POA: Insufficient documentation

## 2016-05-08 DIAGNOSIS — I10 Essential (primary) hypertension: Secondary | ICD-10-CM | POA: Insufficient documentation

## 2016-05-08 LAB — POC URINE PREG, ED: PREG TEST UR: NEGATIVE

## 2016-05-08 MED ORDER — CEPHALEXIN 500 MG PO CAPS: 500.0000 mg | ORAL_CAPSULE | Freq: Four times a day (QID) | ORAL | 0 refills | Status: DC

## 2016-05-08 MED ORDER — SULFAMETHOXAZOLE-TRIMETHOPRIM 800-160 MG PO TABS: 1.0000 | ORAL_TABLET | Freq: Two times a day (BID) | ORAL | 0 refills | Status: AC

## 2016-05-08 NOTE — ED Provider Notes (Signed)
[facility]-EM[facility]ENCY DEPT Provider Note   CSN: [id number] Arrival date & time: 05/08/16  0941     History   Chief Complaint Chief Complaint  Patient presents with  . Breast Pain    HPI Virginia Davis is a 30 y.o. female.  Patient is a 30 year old female with history of diabetes and obesity. She presents for evaluation of left breast pain. This began yesterday in the absence of any injury or trauma. She denies any drainage, discharge, or significant swelling. She denies the possibility of pregnancy.   The history is provided by the patient.    Past Medical History:  Diagnosis Date  . Asthma   . Diabetes mellitus without complication (HCC)    not taking metformin anymore, hasn't seen MD for this since 2014  . Hypertension     Patient Active Problem List   Diagnosis Date Noted  . Menometrorrhagia 02/15/2015  . PCOS (polycystic ovarian syndrome) 02/15/2015  . Morbid obesity with BMI of 50.0-59.9, adult (HCC) 02/15/2015  . Abnormal uterine bleeding 04/25/2013    Past Surgical History:  Procedure Laterality Date  . NO PAST SURGERIES      OB History    Gravida Para Term Preterm AB Living   0 0 0 0 0 0   SAB TAB Ectopic Multiple Live Births   0 0 0 0         Home Medications    Prior to Admission medications   Medication Sig Start Date End Date Taking? Authorizing Provider  ibuprofen (ADVIL,MOTRIN) 800 MG tablet Take 1 tablet (800 mg total) by mouth 3 (three) times daily. 04/07/15   Duane LopeJennifer I Rasch, NP  medroxyPROGESTERone (PROVERA) 10 MG tablet Take 1 tablet (10 mg total) by mouth daily. Patient not taking: Reported on 04/26/2015 04/07/15   Duane LopeJennifer I Rasch, NP  Norethindrone-Mestranol (NECON) 1-50 MG-MCG tablet Take 1 tablet by mouth daily. 05/07/15   Adam PhenixJames G Arnold, MD  ondansetron (ZOFRAN) 4 MG tablet Take 1 tablet (4 mg total) by mouth every 6 (six) hours. 04/26/15   Eyvonne MechanicJeffrey Hedges, PA-C  traMADol (ULTRAM) 50 MG tablet Take 1 tablet (50 mg total) by mouth  every 6 (six) hours as needed. Patient taking differently: Take 50 mg by mouth every 6 (six) hours as needed for moderate pain or severe pain.  04/07/15   Duane LopeJennifer I Rasch, NP    Family History Family History  Problem Relation Age of Onset  . Gestational diabetes Mother     Social History Social History  Substance Use Topics  . Smoking status: Never Smoker  . Smokeless tobacco: Never Used  . Alcohol use No     Allergies   Food; Penicillins; and Shellfish allergy   Review of Systems Review of Systems  All other systems reviewed and are negative.    Physical Exam Updated Vital Signs BP 141/80   Pulse 93   Temp 98 F (36.7 C)   Resp 18   Wt (!) 337 lb 6 oz (153 kg)   SpO2 98%   BMI 57.02 kg/m   Physical Exam  Constitutional: She is oriented to person, place, and time. She appears well-developed and well-nourished. No distress.  HENT:  Head: Normocephalic and atraumatic.  Neck: Normal range of motion. Neck supple.  Musculoskeletal: Normal range of motion.  Neurological: She is alert and oriented to person, place, and time.  Skin: Skin is warm and dry. She is not diaphoretic.  The left breast is noted to have a small pustule on the  nipple. There is tenderness to the nipple itself, however no palpable fluctuance or mass. The right breast appears symmetrical and nontender.  Nursing note and vitals reviewed.    ED Treatments / Results  Labs (all labs ordered are listed, but only abnormal results are displayed) Labs Reviewed  POC URINE PREG, ED    EKG  EKG Interpretation None       Radiology No results found.  Procedures Procedures (including critical care time)  Medications Ordered in ED Medications - No data to display   Initial Impression / Assessment and Plan / ED Course  I have reviewed the triage vital signs and the nursing notes.  Pertinent labs & imaging results that were available during my care of the patient were reviewed by me and  considered in my medical decision making (see chart for details).  Clinical Course    Patient presents with complaints of breast pain. There appears to be a small pustule at the end of the nipple. There is also some fullness to the nipple itself, however no fluctuance in the breast tissue. I am uncertain as to whether this is a developing abscess or mastitis, or some other process. At this time she will be treated with Keflex, warm soaks, and when necessary return if she worsens. She will also be set up for an outpatient mammogram.  Final Clinical Impressions(s) / ED Diagnoses   Final diagnoses:  None    New Prescriptions New Prescriptions   No medications on file     Geoffery Lyonsouglas Trinity Haun, MD 05/08/16 1229

## 2016-05-08 NOTE — Discharge Instructions (Signed)
Bactrim as prescribed.  Apply warm soaks as frequently as possible for the next several days.  Return to the emergency department if your symptoms significantly worsen or change.  Follow-up at the breast center for a mammogram. The contact information has been provided in this discharge summary Virginia Davis: Monday to make these arrangements.

## 2016-05-08 NOTE — ED Triage Notes (Signed)
Pt states she has had a shooting pain through her nipple in her left breast. This started yesterday. Pt states bras hurt her. Nipple is red and swollen. Denies injury

## 2016-05-10 ENCOUNTER — Other Ambulatory Visit (HOSPITAL_COMMUNITY): Payer: Self-pay | Admitting: *Deleted

## 2016-05-10 DIAGNOSIS — N644 Mastodynia: Secondary | ICD-10-CM

## 2016-05-13 ENCOUNTER — Other Ambulatory Visit (HOSPITAL_COMMUNITY): Payer: Self-pay | Admitting: *Deleted

## 2016-05-13 ENCOUNTER — Ambulatory Visit
Admission: RE | Admit: 2016-05-13 | Discharge: 2016-05-13 | Disposition: A | Discharge status: Home / Self Care | Payer: Medicaid Other | Source: Ambulatory Visit | Attending: Obstetrics and Gynecology | Admitting: Obstetrics and Gynecology

## 2016-05-13 ENCOUNTER — Ambulatory Visit (HOSPITAL_COMMUNITY)
Admission: RE | Admit: 2016-05-13 | Discharge: 2016-05-13 | Disposition: A | Discharge status: Home / Self Care | Payer: Self-pay | Source: Ambulatory Visit | Attending: Obstetrics and Gynecology | Admitting: Obstetrics and Gynecology

## 2016-05-13 ENCOUNTER — Encounter (HOSPITAL_COMMUNITY): Payer: Self-pay

## 2016-05-13 VITALS — BP 124/82 | Temp 99.0°F | Ht 65.0 in | Wt 339.2 lb

## 2016-05-13 DIAGNOSIS — N644 Mastodynia: Secondary | ICD-10-CM

## 2016-05-13 DIAGNOSIS — Z01419 Encounter for gynecological examination (general) (routine) without abnormal findings: Secondary | ICD-10-CM

## 2016-05-13 NOTE — Patient Instructions (Signed)
Explained breast self awareness to [name]. Let her know [facility] will cover Pap smears every 3 years unless has a history of abnormal Pap smears. [facility]. Appointment scheduled for Thursday, May 13, 2016 at 1515. Let patient know will follow up with her within the next couple weeks with results of Pap smear by phone. Virginia Davis verbalized understanding.  Sally-Ann Cutbirth, Kathaleen Maserhristine Poll, RN 3:13 PM

## 2016-05-13 NOTE — Progress Notes (Signed)
Complaints of left nipple swollen and lump x 6 days. Patient stated she started having swelling and pain within her left nipple Friday and [facility] Saturday, May [facility], 2016. Patient was given antibiotics and is currently taking them. Patient states her left nipple is still swollen but the pain resolved on Wednesday, May 12, 2016.  Pap Smear: Pap smear completed today. [facility] and normal. Per patient has no history of an abnormal Pap smear. Last Pap smear result is in EPIC.  Physical exam: Breasts Breasts symmetrical. No skin abnormalities right breast. Left nipple swollen. No nipple retraction bilateral breasts. No nipple discharge bilateral breasts. No lymphadenopathy. No lumps palpated bilateral breasts. Complaints of left center of breast under nipple tenderness on exam. [facility] ultrasound. Appointment scheduled for Thursday, May 13, 2016 at 1515.  Pelvic/Bimanual   Ext Genitalia No lesions, no swelling and no discharge observed on external genitalia.         Vagina Vagina pink and normal texture. No lesions or discharge observed in vagina.          Cervix Cervix is present. Cervix pink and of normal texture. No discharge observed.     Uterus Uterus is present and palpable. Uterus in normal position and normal size.        Adnexae Bilateral ovaries present and unable to palpate. No tenderness on palpation.          Rectovaginal No rectal exam completed today since patient had no rectal complaints. No skin abnormalities observed on exam.    Smoking History: Patient has never smoked.  Patient Navigation: Patient education provided. [facility].

## 2016-05-14 ENCOUNTER — Inpatient Hospital Stay: Admission: RE | Admit: 2016-05-14 | Payer: Medicaid Other | Source: Ambulatory Visit

## 2016-05-17 ENCOUNTER — Encounter (HOSPITAL_COMMUNITY): Payer: Self-pay | Admitting: *Deleted

## 2016-05-17 LAB — CYTOLOGY - PAP
Diagnosis: NEGATIVE
HPV: NOT DETECTED

## 2016-05-18 ENCOUNTER — Telehealth (HOSPITAL_COMMUNITY): Payer: Self-pay | Admitting: *Deleted

## 2016-05-18 NOTE — Telephone Encounter (Signed)
Telephoned patient at home number and no answer. 

## 2016-05-31 ENCOUNTER — Telehealth (HOSPITAL_COMMUNITY): Payer: Self-pay | Admitting: *Deleted

## 2016-05-31 NOTE — Telephone Encounter (Signed)
Patient returned call. Advised patient pap smear was negative. HPV was negative. Next pap smear due in three years. Patient voiced understanding.

## 2016-05-31 NOTE — Telephone Encounter (Signed)
Telephoned patient at home number and left message to return call to [facility] 

## 2017-02-19 ENCOUNTER — Emergency Department (HOSPITAL_COMMUNITY)
Admission: EM | Admit: 2017-02-19 | Discharge: 2017-02-19 | Disposition: A | Discharge status: Home / Self Care | Payer: BLUE CROSS/BLUE SHIELD | Attending: Emergency Medicine | Admitting: Emergency Medicine

## 2017-02-19 ENCOUNTER — Encounter (HOSPITAL_COMMUNITY): Payer: Self-pay

## 2017-02-19 DIAGNOSIS — Y929 Unspecified place or not applicable: Secondary | ICD-10-CM | POA: Diagnosis not present

## 2017-02-19 DIAGNOSIS — J45909 Unspecified asthma, uncomplicated: Secondary | ICD-10-CM | POA: Diagnosis not present

## 2017-02-19 DIAGNOSIS — Y999 Unspecified external cause status: Secondary | ICD-10-CM | POA: Insufficient documentation

## 2017-02-19 DIAGNOSIS — X58XXXA Exposure to other specified factors, initial encounter: Secondary | ICD-10-CM | POA: Diagnosis not present

## 2017-02-19 DIAGNOSIS — T7840XA Allergy, unspecified, initial encounter: Secondary | ICD-10-CM

## 2017-02-19 MED ORDER — DEXAMETHASONE 4 MG PO TABS
10.0000 mg | ORAL_TABLET | Freq: Once | ORAL | Status: AC
  Administered 2017-02-19: 10 mg via ORAL
  Filled 2017-02-19: qty 2

## 2017-02-19 NOTE — ED Triage Notes (Signed)
PT C/O ITCHING ALL OVER AND SCRATCHY THROAT WITH TIGHTNESS AFTER BLEACHING HER HAIR AT 0230. PT ABLE TO SPEAK ING FULL SENTENCES WITH NO RESPIRATORY DISTRESS.

## 2017-02-19 NOTE — ED Provider Notes (Signed)
[facility]-EMERGENCY DEPT Provider Note   CSN: [id number] Arrival date & time: 02/19/17  0354     History   Chief Complaint Chief Complaint  Patient presents with  . Allergic Reaction    HPI Virginia Davis is a 31 y.o. female.  The history is provided by the patient.  Allergic Reaction  She had titer here with some bleach, and applied some conditioner and fallen asleep. She woke up with itching across her anterior chest and in her throat and a sense that her throat was closing off. This is the same reaction she has when she has an allergic reaction to shellfish or pistachios. She did not treat herself at home. Symptoms are subsiding on their own, although there is still some residual itching in her throat. She denies fever or chills. She denied difficulty breathing or swallowing. Nothing made symptoms better, nothing made it worse.  Past Medical History:  Diagnosis Date  . Asthma   . Diabetes mellitus without complication (HCC)    not taking metformin anymore, hasn't seen MD for this since 2014  . Hypertension     Patient Active Problem List   Diagnosis Date Noted  . Menometrorrhagia 02/15/2015  . PCOS (polycystic ovarian syndrome) 02/15/2015  . Morbid obesity with BMI of 50.0-59.9, adult (HCC) 02/15/2015  . Abnormal uterine bleeding 04/25/2013    Past Surgical History:  Procedure Laterality Date  . NO PAST SURGERIES      OB History    Gravida Para Term Preterm AB Living   0 0 0 0 0 0   SAB TAB Ectopic Multiple Live Births   0 0 0 0         Home Medications    Prior to Admission medications   Medication Sig Start Date End Date Taking? Authorizing Provider  albuterol (PROVENTIL HFA;VENTOLIN HFA) 108 (90 Base) MCG/ACT inhaler Inhale 1 puff into the lungs every 6 (six) hours as needed for wheezing or shortness of breath.   Yes [provider]    Family History Family History  Problem Relation Age of Onset  . Gestational diabetes Mother   .  Hypertension Maternal Grandmother     Social History Social History  Substance Use Topics  . Smoking status: Never Smoker  . Smokeless tobacco: Never Used  . Alcohol use No     Allergies   Food; Penicillins; and Shellfish allergy   Review of Systems Review of Systems  All other systems reviewed and are negative.    Physical Exam Updated Vital Signs BP 122/64 (BP Location: Left Arm)   Pulse 72   Temp 97.9 F (36.6 C) (Oral)   Resp 16   Ht 5\' 5"  (1.651 m)   Wt (!) 153.3 kg (338 lb)   LMP 02/14/2017   SpO2 98%   BMI 56.25 kg/m   Physical Exam  Nursing note and vitals reviewed.  31 year old female, resting comfortably and in no acute distress. Vital signs are normal. Oxygen saturation is 98%, which is normal. Head is normocephalic and atraumatic. PERRLA, EOMI. Oropharynx is clear. Neck is nontender and supple without adenopathy or JVD. Back is nontender and there is no CVA tenderness. Lungs are clear without rales, wheezes, or rhonchi. Chest is nontender. Heart has regular rate and rhythm without murmur. Abdomen is soft, flat, nontender without masses or hepatosplenomegaly and peristalsis is normoactive. Extremities have no cyanosis or edema, full range of motion is present. Skin is warm and dry without rash. Neurologic: Mental status is normal,  cranial nerves are intact, there are no motor or sensory deficits.  ED Treatments / Results   Procedures Procedures (including critical care time)  Medications Ordered in ED Medications  dexamethasone (DECADRON) tablet 10 mg (not administered)     Initial Impression / Assessment and Plan / ED Course  I have reviewed the triage vital signs and the nursing notes.  Acute allergic reaction which is resolving spontaneously. She is given a dose of dexamethasone in the ED, and advised to use over-the-counter antihistamines at home as needed. Return precautions discussed. No indication for prescribing epinephrine  autoinjector at this time.  Final Clinical Impressions(s) / ED Diagnoses   Final diagnoses:  Allergic reaction, initial encounter    New Prescriptions Current Discharge Medication List       Dione BoozeGlick, Takima Encina, MD 02/19/17 [phone]

## 2017-02-19 NOTE — ED Notes (Signed)
Patient states she is itching all over and her throat feels scratchy. Patient with no visible rash-patient only dyed the ends of her hair earlier. Speaking in full sentences with no muffling. No wheezing or respiratory distress.

## 2017-02-19 NOTE — ED Notes (Signed)
Evaluated by Dr. Preston FleetingGlick

## 2017-02-19 NOTE — Discharge Instructions (Signed)
Take loratadine (Claritin) or cetirizine (Zyrtec) once a day. Take diphenhydramine (Benadryl) every four hours as needed for itching not controlled by the other medications. Return if symptoms are getting worse.

## 2017-06-16 ENCOUNTER — Encounter (HOSPITAL_COMMUNITY): Payer: Self-pay | Admitting: Nurse Practitioner

## 2017-06-16 ENCOUNTER — Emergency Department (HOSPITAL_COMMUNITY)
Admission: EM | Admit: 2017-06-16 | Discharge: 2017-06-16 | Disposition: A | Discharge status: Home / Self Care | Payer: BLUE CROSS/BLUE SHIELD | Attending: Emergency Medicine | Admitting: Emergency Medicine

## 2017-06-16 ENCOUNTER — Other Ambulatory Visit: Payer: Self-pay

## 2017-06-16 DIAGNOSIS — J029 Acute pharyngitis, unspecified: Secondary | ICD-10-CM | POA: Insufficient documentation

## 2017-06-16 DIAGNOSIS — B9789 Other viral agents as the cause of diseases classified elsewhere: Secondary | ICD-10-CM | POA: Diagnosis not present

## 2017-06-16 DIAGNOSIS — J45909 Unspecified asthma, uncomplicated: Secondary | ICD-10-CM | POA: Insufficient documentation

## 2017-06-16 DIAGNOSIS — I10 Essential (primary) hypertension: Secondary | ICD-10-CM | POA: Diagnosis not present

## 2017-06-16 DIAGNOSIS — E119 Type 2 diabetes mellitus without complications: Secondary | ICD-10-CM | POA: Diagnosis not present

## 2017-06-16 LAB — RAPID STREP SCREEN (MED CTR MEBANE ONLY): Streptococcus, Group A Screen (Direct): NEGATIVE

## 2017-06-16 NOTE — ED Triage Notes (Signed)
Patient c/o of sore throat. 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 She reports yesterday feeling of throat pain and discomfort. Feels as if she may have strep. Denies any headaches, cough, congestion, or fever. Patient is ambulatory and speaks in complete sentences without difficulty.

## 2017-06-16 NOTE — ED Provider Notes (Signed)
[facility]-EMERGENCY DEPT Provider Note   CSN: [id number] Arrival date & time: 06/16/17  0841     History   Chief Complaint Chief Complaint  Patient presents with  . Sore Throat    HPI Virginia Davis is a 31 y.o. female.  The history is provided by medical records and the patient. No language interpreter was used.    Virginia Davis is a 31 y.o. female  with a PMH of HTN, DM, asthma who presents to the Emergency Department complaining of sore throat beginning around noon last night.  This morning, she awoke and sore throat was worse.  Painful when swallowing.  She had a cup of ice while in the ED and states that makes her throat feel better.  She tried ibuprofen this morning with little improvement.  Denies any other symptoms.  No cough, congestion, fever, chills, rash, abdominal pain, nausea, vomiting, diarrhea.  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  She is concerned that she may have strep throat, but no children at daycare currently that she is aware of has tested positive.  Past Medical History:  Diagnosis Date  . Asthma   . Diabetes mellitus without complication (HCC)    not taking metformin anymore, hasn't seen MD for this since 2014  . Hypertension     Patient Active Problem List   Diagnosis Date Noted  . Menometrorrhagia 02/15/2015  . PCOS (polycystic ovarian syndrome) 02/15/2015  . Morbid obesity with BMI of 50.0-59.9, adult (HCC) 02/15/2015  . Abnormal uterine bleeding 04/25/2013    Past Surgical History:  Procedure Laterality Date  . NO PAST SURGERIES      OB History    Gravida Para Term Preterm AB Living   0 0 0 0 0 0   SAB TAB Ectopic Multiple Live Births   0 0 0 0         Home Medications    Prior to Admission medications   Medication Sig Start Date End Date Taking? Authorizing Provider  albuterol (PROVENTIL HFA;VENTOLIN HFA) 108 (90 Base) MCG/ACT inhaler Inhale 1 puff into the  lungs every 6 (six) hours as needed for wheezing or shortness of breath.    [provider]    Family History Family History  Problem Relation Age of Onset  . Gestational diabetes Mother   . Hypertension Maternal Grandmother     Social History Social History   Tobacco Use  . Smoking status: Never Smoker  . Smokeless tobacco: Never Used  Substance Use Topics  . Alcohol use: No  . Drug use: No     Allergies   Food; Penicillins; and Shellfish allergy   Review of Systems Review of Systems  Constitutional: Negative for chills and fever.  HENT: Positive for sore throat. Negative for congestion.   Respiratory: Negative for cough and shortness of breath.   Gastrointestinal: Negative for abdominal pain, diarrhea, nausea and vomiting.  Musculoskeletal: Negative for arthralgias and myalgias.  Skin: Negative for rash.     Physical Exam Updated Vital Signs BP (!) 144/76 (BP Location: Right Arm)   Pulse 80   Temp 98.3 F (36.8 C) (Oral)   Resp 18   Ht 5\' 5"  (1.651 m)   Wt (!) 149.7 kg (330 lb)   LMP 05/23/2017 Comment: irregular cycles on provera for regulation   SpO2 99%   BMI 54.91 kg/m   Physical Exam  Constitutional: She is oriented to person, place, and time. She  appears well-developed and well-nourished. No distress.  HENT:  Head: Normocephalic and atraumatic.  OP with erythema, no exudates or tonsillar hypertrophy.  Neck: Normal range of motion. Neck supple.  Cardiovascular: Normal rate, regular rhythm and normal heart sounds.  Pulmonary/Chest: Effort normal.  Lungs are clear to auscultation bilaterally - no w/r/r  Abdominal: Soft. She exhibits no distension. There is no tenderness.  Musculoskeletal: Normal range of motion.  Neurological: She is alert and oriented to person, place, and time.  Skin: Skin is warm and dry. She is not diaphoretic.  Nursing note and vitals reviewed.    ED Treatments / Results  Labs (all labs ordered are listed, but  only abnormal results are displayed) Labs Reviewed  RAPID STREP SCREEN (NOT AT [facility])  CULTURE, GROUP A STREP [facility])    EKG  EKG Interpretation None       Radiology No results found.  Procedures Procedures (including critical care time)  Medications Ordered in ED Medications - No data to display   Initial Impression / Assessment and Plan / ED Course  I have reviewed the triage vital signs and the nursing notes.  Pertinent labs & imaging results that were available during my care of the patient were reviewed by me and considered in my medical decision making (see chart for details).    Virginia Lekntoinette Prouty is a 31 y.o. female who presents to ED for sore throat since yesterday. On exam, patient afebrile, hemodynamically stable with clear lung exam. OP with erythema, but no exudates or tonsillar hypertrophy. Early URI / pharyngitis vs. Strep. Will obtain strep screen.   Rapid strep negative.   Symptomatic home care instructions discussed. PCP follow up if no improvement. All questions answered.   Final Clinical Impressions(s) / ED Diagnoses   Final diagnoses:  Viral pharyngitis    ED Discharge Orders    None       Robbi Spells, Chase PicketJaime Pilcher, PA-C 06/16/17 1020    Azalia Bilisampos, Kevin, MD 06/16/17 1715

## 2017-06-16 NOTE — Progress Notes (Signed)
Discharge in stable condition.  Instructions were given to patient and family

## 2017-06-16 NOTE — Discharge Instructions (Signed)
Alternate between Tylenol and ibuprofen as needed for pain. Gargle warm salt water and spit it out. It is very important to stay hydrated!  Follow up with your primary care doctor in 5-7 days for recheck if ongoing symptoms and return to emergency department if any new or worsening of symptoms develop or you have any additional concerns.

## 2017-06-18 LAB — CULTURE, GROUP A STREP (THRC)

## 2019-03-03 ENCOUNTER — Other Ambulatory Visit: Payer: Self-pay

## 2019-03-03 ENCOUNTER — Emergency Department (HOSPITAL_COMMUNITY)
Admission: EM | Admit: 2019-03-03 | Discharge: 2019-03-03 | Disposition: A | Discharge status: Home / Self Care | Payer: BC Managed Care – PPO | Attending: Emergency Medicine | Admitting: Emergency Medicine

## 2019-03-03 ENCOUNTER — Encounter (HOSPITAL_COMMUNITY): Payer: Self-pay

## 2019-03-03 ENCOUNTER — Emergency Department (HOSPITAL_COMMUNITY): Payer: BC Managed Care – PPO

## 2019-03-03 ENCOUNTER — Inpatient Hospital Stay (EMERGENCY_DEPARTMENT_HOSPITAL)
Admission: AD | Admit: 2019-03-03 | Discharge: 2019-03-03 | Disposition: A | Discharge status: Home / Self Care | Payer: BC Managed Care – PPO | Source: Home / Self Care | Attending: Obstetrics & Gynecology | Admitting: Obstetrics & Gynecology

## 2019-03-03 DIAGNOSIS — R319 Hematuria, unspecified: Secondary | ICD-10-CM | POA: Insufficient documentation

## 2019-03-03 DIAGNOSIS — R1032 Left lower quadrant pain: Secondary | ICD-10-CM | POA: Insufficient documentation

## 2019-03-03 DIAGNOSIS — Z3202 Encounter for pregnancy test, result negative: Secondary | ICD-10-CM

## 2019-03-03 DIAGNOSIS — I10 Essential (primary) hypertension: Secondary | ICD-10-CM | POA: Insufficient documentation

## 2019-03-03 DIAGNOSIS — E119 Type 2 diabetes mellitus without complications: Secondary | ICD-10-CM | POA: Diagnosis not present

## 2019-03-03 DIAGNOSIS — Z789 Other specified health status: Secondary | ICD-10-CM

## 2019-03-03 DIAGNOSIS — J45909 Unspecified asthma, uncomplicated: Secondary | ICD-10-CM | POA: Diagnosis not present

## 2019-03-03 LAB — URINALYSIS, ROUTINE W REFLEX MICROSCOPIC
Bilirubin Urine: NEGATIVE
Glucose, UA: NEGATIVE mg/dL
Ketones, ur: NEGATIVE mg/dL
Leukocytes,Ua: NEGATIVE
Nitrite: NEGATIVE
Protein, ur: NEGATIVE mg/dL
Specific Gravity, Urine: 1.01 (ref 1.005–1.030)
pH: 6 (ref 5.0–8.0)

## 2019-03-03 LAB — BASIC METABOLIC PANEL
Anion gap: 9 (ref 5–15)
BUN: 5 mg/dL — ABNORMAL LOW (ref 6–20)
CO2: 25 mmol/L (ref 22–32)
Calcium: 8.9 mg/dL (ref 8.9–10.3)
Chloride: 105 mmol/L (ref 98–111)
Creatinine, Ser: 0.69 mg/dL (ref 0.44–1.00)
GFR calc Af Amer: >60 mL/min (ref >60)
GFR calc non Af Amer: >60 mL/min (ref >60)
Glucose, Bld: 81 mg/dL (ref 70–99)
Potassium: 3.5 mmol/L (ref 3.5–5.1)
Sodium: 139 mmol/L (ref 135–145)

## 2019-03-03 LAB — I-STAT BETA HCG BLOOD, ED (MC, WL, AP ONLY): I-stat hCG, quantitative: <5 m[IU]/mL (ref <5)

## 2019-03-03 LAB — CBC
HCT: 35.8 % — ABNORMAL LOW (ref 36.0–46.0)
Hemoglobin: 11.3 g/dL — ABNORMAL LOW (ref 12.0–15.0)
MCH: 29.8 pg (ref 26.0–34.0)
MCHC: 31.6 g/dL (ref 30.0–36.0)
MCV: 94.5 fL (ref 80.0–100.0)
Platelets: 233 10*3/uL (ref 150–400)
RBC: 3.79 MIL/uL — ABNORMAL LOW (ref 3.87–5.11)
RDW: 12.8 % (ref 11.5–15.5)
WBC: 5.3 10*3/uL (ref 4.0–10.5)
nRBC: 0 % (ref 0.0–0.2)

## 2019-03-03 MED ORDER — KETOROLAC TROMETHAMINE 30 MG/ML IJ SOLN
15.0000 mg | Freq: Once | INTRAMUSCULAR | Status: AC
  Administered 2019-03-03: 15 mg via INTRAMUSCULAR
  Filled 2019-03-03: qty 1

## 2019-03-03 NOTE — MAU Provider Note (Signed)
First Provider Initiated Contact with Patient 03/03/19 1313      S Ms. Indi Willhite is a 33 y.o. G0P0000 non-pregnant female who presents to MAU today with complaint of blood in urine.   O BP 120/64 (BP Location: Right Arm)   Pulse (!) 59   Temp 98.3 F (36.8 C) (Oral)   Resp 16   SpO2 100%  Physical Exam  Constitutional: She is oriented to person, place, and time. She appears well-developed and well-nourished. No distress.  HENT:  Head: Normocephalic and atraumatic.  Respiratory: Effort normal.  Neurological: She is alert and oriented to person, place, and time.  Skin: Skin is warm and dry. She is not diaphoretic.  Psychiatric: She has a normal mood and affect. Her behavior is normal. Judgment and thought content normal.    A Non pregnant female Medical screening exam complete  P Discharge from MAU in stable condition Patient given the option of transfer to [facility] for further evaluation or seek care in outpatient facility of choice List of options for follow-up given  Warning signs for worsening condition that would warrant emergency follow-up discussed Patient may return to MAU as needed for pregnancy related complaints  Yvonnia Tango, Gerrie Nordmann, NP 03/03/2019 1:17 PM

## 2019-03-03 NOTE — Discharge Instructions (Signed)
A culture was sent of your urine today to determine if there is any bacterial growth. If the results of the culture are positive and you require an antibiotic or a change of your prescribed antibiotic you will be contacted by the hospital. If the results are negative you will not be contacted.  Please follow up with the urologist within 5-7 days for re-evaluation of your symptoms.   Please return to the emergency department for any new or worsening symptoms.

## 2019-03-03 NOTE — MAU Note (Signed)
Virginia Davis is a 33 y.o. at Unknown here in MAU reporting: states she has been seeing blood in her urine for the past 2 days. Having back pain and left lower abdominal pain. No increased urgency or frequency.  LMP:  02/10/19, pt states she is not pregnant, is in a monogamous relationship with her wife  Onset of complaint: the past 2 days  Pain score: back 6/10, abdomen 6/10  Vitals:   03/03/19 1309  BP: 120/64  Pulse: (!) 59  Resp: 16  Temp: 98.3 F (36.8 C)  SpO2: 100%      Lab orders placed from triage: none
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
## 2019-03-03 NOTE — ED Triage Notes (Signed)
Pt reports 2 weeks of blood in her urine, along with right sided flank pain that radiates to her back. No n/v

## 2019-03-03 NOTE — ED Notes (Signed)
Patient verbalizes understanding of discharge instructions . Opportunity for questions and answers were provided . Armband removed by staff ,Pt discharged from ED. W/C  offered at D/C  and Declined W/C at D/C and was escorted to lobby by RN.  

## 2019-03-05 LAB — URINE CULTURE: Culture: <10000 — AB

## 2019-03-23 ENCOUNTER — Emergency Department (HOSPITAL_COMMUNITY)
Admission: EM | Admit: 2019-03-23 | Discharge: 2019-03-23 | Disposition: A | Discharge status: Home / Self Care | Payer: BC Managed Care – PPO | Attending: Emergency Medicine | Admitting: Emergency Medicine

## 2019-03-23 ENCOUNTER — Other Ambulatory Visit: Payer: Self-pay

## 2019-03-23 DIAGNOSIS — I10 Essential (primary) hypertension: Secondary | ICD-10-CM | POA: Insufficient documentation

## 2019-03-23 DIAGNOSIS — J45909 Unspecified asthma, uncomplicated: Secondary | ICD-10-CM | POA: Insufficient documentation

## 2019-03-23 DIAGNOSIS — K0889 Other specified disorders of teeth and supporting structures: Secondary | ICD-10-CM | POA: Insufficient documentation

## 2019-03-23 DIAGNOSIS — E119 Type 2 diabetes mellitus without complications: Secondary | ICD-10-CM | POA: Diagnosis not present

## 2019-03-23 LAB — POC URINE PREG, ED: Preg Test, Ur: NEGATIVE

## 2019-03-23 MED ORDER — CLINDAMYCIN HCL 300 MG PO CAPS
300.0000 mg | ORAL_CAPSULE | Freq: Once | ORAL | Status: AC
  Administered 2019-03-23: 23:00:00 300 mg via ORAL
  Filled 2019-03-23: qty 1

## 2019-03-23 MED ORDER — HYDROCODONE-ACETAMINOPHEN 5-325 MG PO TABS: 1.0000 | ORAL_TABLET | Freq: Three times a day (TID) | ORAL | 0 refills | Status: DC | PRN

## 2019-03-23 MED ORDER — OXYCODONE-ACETAMINOPHEN 5-325 MG PO TABS
2.0000 | ORAL_TABLET | Freq: Once | ORAL | Status: AC
  Administered 2019-03-23: 2 via ORAL
  Filled 2019-03-23: qty 2

## 2019-03-23 MED ORDER — CLINDAMYCIN HCL 150 MG PO CAPS: 450.0000 mg | ORAL_CAPSULE | Freq: Four times a day (QID) | ORAL | 0 refills | Status: AC

## 2019-03-23 NOTE — ED Triage Notes (Signed)
Pt reports dental pain starting yesterday, pain shoots to L ear.

## 2019-03-23 NOTE — Discharge Instructions (Signed)
Please follow-up with one of the dental clinics provided to you below or in your paperwork. Call and tell them you were seen in the Emergency Dept and arrange for an appointment. You may have to call multiple places in order to find a place to be seen.  Dental Assistance If the dentist on-call cannot see you, please use the resources below:   Patients with Medicaid: [facility] [address], [phone], [phone]  If unable to pay, or uninsured, contact [facility] ([phone]) or [facility] ([phone] in [place], [phone] in [place]) to become qualified for the adult dental clinic  Other Low-Cost Community Dental Services: [facility]- [address], [place], [place], [address]    [phone], Ext. [phone]    2nd and 4th Thursday of the month at 6:30am    10 clients each day by appointment, can sometimes see walk-in     patients if someone does not show for an appointment [facility]- [address], [place], [place], [address]    [phone] [facility]- [address], [place], [place], [address]    [phone]  [facility]- [phone] [facility]- [phone] [facility]- [phone]     Take antibiotics as directed. Please take all of your antibiotics until finished.  You can take Tylenol or Ibuprofen as directed for pain. You can alternate Tylenol and Ibuprofen every 4 hours. If you take Tylenol at 1pm, then you can take Ibuprofen at 5pm. Then you can take Tylenol again at 9pm.   The exam and treatment you received today has been provided on an emergency basis only. This is not a substitute for complete medical or dental care. If your problem worsens or new symptoms (problems) appear, and you are unable to arrange prompt follow-up care with your dentist, call or return to this location. If you do not have a dentist, please  follow-up with one on the list provided  CALL YOUR DENTIST OR RETURN IMMEDIATELY IF you develop a fever, rash, difficulty breathing or swallowing, neck or facial swelling, or other potentially serious concerns.  

## 2019-03-23 NOTE — ED Provider Notes (Signed)
[facility]-EMERGENCY DEPT Provider Note   CSN: [id number] Arrival date & time: 03/23/19  2009     History   Chief Complaint Chief Complaint  Patient presents with  . Dental Pain  . Otalgia    HPI Virginia Davis is a 33 y.o. female history of asthma, diabetes, hypertension who presents for evaluation of left-sided dental pain that began yesterday.  She reports that a few months ago, she noticed that her left upper wisdom tooth started to crack.  She states that it has not caused her any issues until yesterday.  States that the pain has been radiating to her ear and her face.  She has not noticed any overlying warmth or erythema of her face.  She has not had any fevers.  She has not noticed any difficulty breathing or vomiting.  She states that she does not see a dentist.  She has been taking Tylenol for pain with no improvement.  She has a history of gastric sleeve and cannot take NSAIDs.     The history is provided by the patient.    Past Medical History:  Diagnosis Date  . Asthma   . Diabetes mellitus without complication (HCC)    not taking metformin anymore, hasn't seen MD for this since 2014  . Hypertension     Patient Active Problem List   Diagnosis Date Noted  . Menometrorrhagia 02/15/2015  . PCOS (polycystic ovarian syndrome) 02/15/2015  . Morbid obesity with BMI of 50.0-59.9, adult (HCC) 02/15/2015  . Abnormal uterine bleeding 04/25/2013    Past Surgical History:  Procedure Laterality Date  . NO PAST SURGERIES       OB History    Gravida  0   Para  0   Term  0   Preterm  0   AB  0   Living  0     SAB  0   TAB  0   Ectopic  0   Multiple  0   Live Births               Home Medications    Prior to Admission medications   Medication Sig Start Date End Date Taking? Authorizing Provider  albuterol (PROVENTIL HFA;VENTOLIN HFA) 108 (90 Base) MCG/ACT inhaler Inhale 1 puff into the lungs every 6 (six) hours as  needed for wheezing or shortness of breath.    [provider]  clindamycin (CLEOCIN) 150 MG capsule Take 3 capsules (450 mg total) by mouth every 6 (six) hours for 7 days. 03/23/19 03/30/19  Maxwell CaulLayden,  A, PA-C  HYDROcodone-acetaminophen (NORCO/VICODIN) 5-325 MG tablet Take 1 tablet by mouth every 8 (eight) hours as needed. 03/23/19   Maxwell CaulLayden,  A, PA-C    Family History Family History  Problem Relation Age of Onset  . Gestational diabetes Mother   . Hypertension Maternal Grandmother     Social History Social History   Tobacco Use  . Smoking status: Never Smoker  . Smokeless tobacco: Never Used  Substance Use Topics  . Alcohol use: No  . Drug use: No     Allergies   Food, Penicillins, and Shellfish allergy   Review of Systems Review of Systems  Constitutional: Negative for fever.  HENT: Positive for dental problem. Negative for facial swelling.   Cardiovascular: Negative for chest pain.  Gastrointestinal: Negative for abdominal pain, nausea and vomiting.  Musculoskeletal: Negative for gait problem.  All other systems reviewed and are negative.    Physical Exam Updated Vital  Signs BP (!) 134/91   Pulse 60   Temp 98.1 F (36.7 C) (Oral)   Resp 18   SpO2 99%   Physical Exam Vitals signs and nursing note reviewed.  Constitutional:      Appearance: She is well-developed.  HENT:     Head: Normocephalic and atraumatic.     Comments: Face is symmetric in appearance without any overlying warmth, erythema, edema.     Right Ear: Tympanic membrane normal.     Left Ear: Tympanic membrane normal.     Mouth/Throat:      Comments: Airways patent, phonation is intact.  Uvula is midline.  No submandibular swelling.  No swelling noted floor of mouth. Eyes:     General: No scleral icterus.       Right eye: No discharge.        Left eye: No discharge.     Conjunctiva/sclera: Conjunctivae normal.  Pulmonary:     Effort: Pulmonary effort is normal.  Skin:     General: Skin is warm and dry.  Neurological:     Mental Status: She is alert.  Psychiatric:        Speech: Speech normal.        Behavior: Behavior normal.      ED Treatments / Results  Labs (all labs ordered are listed, but only abnormal results are displayed) Labs Reviewed  POC URINE PREG, ED    EKG None  Radiology No results found.  Procedures Procedures (including critical care time)  Medications Ordered in ED Medications  oxyCODONE-acetaminophen (PERCOCET/ROXICET) 5-325 MG per tablet 2 tablet (2 tablets Oral Given 03/23/19 2221)  clindamycin (CLEOCIN) capsule 300 mg (300 mg Oral Given 03/23/19 2248)     Initial Impression / Assessment and Plan / ED Course  I have reviewed the triage vital signs and the nursing notes.  Pertinent labs & imaging results that were available during my care of the patient were reviewed by me and considered in my medical decision making (see chart for details).        33 y.o. F  presents with 1 day of dental pain. No evidence of abscess requiring immediate incision and drainage. Exam not concerning for Ludwig's angina or pharyngeal abscess. Will treat with clindamycin given that she is allergic to penicillin.  Patient reviewed on PMP.  No recent narcotic prescriptions.  She cannot take ibuprofen given her history of gastric bypass.  Patient instructed to follow-up with dentist referral provided. Stable for discharge at this time. Strict return precautions discussed. Patient expresses understanding and agreement to plan.  Portions of this note were generated with [employer]. Dictation errors may occur despite best attempts at proofreading.   Final Clinical Impressions(s) / ED Diagnoses   Final diagnoses:  Pain, dental    ED Discharge Orders         Ordered    HYDROcodone-acetaminophen (NORCO/VICODIN) 5-325 MG tablet  Every 8 hours PRN     03/23/19 2320    clindamycin (CLEOCIN) 150 MG capsule  Every 6 hours      03/23/19 2320           Volanda Napoleon, PA-C 03/24/19 0128    Fredia Sorrow, MD 04/07/19 [phone]

## 2019-04-18 ENCOUNTER — Other Ambulatory Visit: Payer: Self-pay

## 2019-04-18 ENCOUNTER — Emergency Department (HOSPITAL_COMMUNITY)
Admission: EM | Admit: 2019-04-18 | Discharge: 2019-04-18 | Disposition: A | Discharge status: Home / Self Care | Payer: BC Managed Care – PPO | Attending: Emergency Medicine | Admitting: Emergency Medicine

## 2019-04-18 ENCOUNTER — Encounter (HOSPITAL_COMMUNITY): Payer: Self-pay | Admitting: Family Medicine

## 2019-04-18 DIAGNOSIS — T7840XA Allergy, unspecified, initial encounter: Secondary | ICD-10-CM | POA: Diagnosis not present

## 2019-04-18 DIAGNOSIS — E119 Type 2 diabetes mellitus without complications: Secondary | ICD-10-CM | POA: Insufficient documentation

## 2019-04-18 DIAGNOSIS — Z79899 Other long term (current) drug therapy: Secondary | ICD-10-CM | POA: Insufficient documentation

## 2019-04-18 DIAGNOSIS — J45909 Unspecified asthma, uncomplicated: Secondary | ICD-10-CM | POA: Insufficient documentation

## 2019-04-18 DIAGNOSIS — I10 Essential (primary) hypertension: Secondary | ICD-10-CM | POA: Diagnosis not present

## 2019-04-18 DIAGNOSIS — R21 Rash and other nonspecific skin eruption: Secondary | ICD-10-CM | POA: Diagnosis present

## 2019-04-18 LAB — CBG MONITORING, ED: Glucose-Capillary: 85 mg/dL (ref 70–99)

## 2019-04-18 MED ORDER — DIPHENHYDRAMINE HCL 25 MG PO TABS: 25.0000 mg | ORAL_TABLET | Freq: Four times a day (QID) | ORAL | 0 refills | Status: AC

## 2019-04-18 MED ORDER — FAMOTIDINE 20 MG PO TABS: 20.0000 mg | ORAL_TABLET | Freq: Every day | ORAL | 0 refills | Status: DC

## 2019-04-18 MED ORDER — PREDNISONE 10 MG PO TABS: 40.0000 mg | ORAL_TABLET | Freq: Every day | ORAL | 0 refills | Status: AC

## 2019-04-18 MED ORDER — PREDNISONE 20 MG PO TABS
40.0000 mg | ORAL_TABLET | Freq: Once | ORAL | Status: AC
  Administered 2019-04-18: 40 mg via ORAL
  Filled 2019-04-18: qty 2

## 2019-04-18 MED ORDER — PREDNISONE 10 MG PO TABS: 40.0000 mg | ORAL_TABLET | Freq: Every day | ORAL | 0 refills | Status: DC

## 2019-04-18 MED ORDER — EPINEPHRINE 0.3 MG/0.3ML IJ SOAJ: 0.3000 mg | INTRAMUSCULAR | 0 refills | Status: AC | PRN

## 2019-04-18 MED ORDER — DIPHENHYDRAMINE HCL 50 MG/ML IJ SOLN
25.0000 mg | Freq: Once | INTRAMUSCULAR | Status: AC
  Administered 2019-04-18: 03:00:00 25 mg via INTRAVENOUS
  Filled 2019-04-18: qty 1

## 2019-04-18 MED ORDER — FAMOTIDINE IN NACL 20-0.9 MG/50ML-% IV SOLN
20.0000 mg | Freq: Once | INTRAVENOUS | Status: AC
  Administered 2019-04-18: 03:00:00 20 mg via INTRAVENOUS
  Filled 2019-04-18: qty 50

## 2019-04-18 NOTE — ED Notes (Signed)
Pt has no signs of oral swelling or discomfort.

## 2019-04-18 NOTE — ED Provider Notes (Signed)
[facility] DEPT Provider Note  CSN: [id number] Arrival date & time: 04/18/19 0143  Chief Complaint(s) Allergic Reaction  HPI Virginia Davis is a 33 y.o. female   The history is provided by the patient.  Allergic Reaction Presenting symptoms: itching and rash   Presenting symptoms: no difficulty breathing, no difficulty swallowing, no swelling and no wheezing   Severity:  Moderate Duration:  45 minutes Prior allergic episodes:  Food/nut allergies (prior reaction to pistachios; ate penuts M&Ms tonight (eats PB and penuts - no reaction to it in past) ) Context: nuts   Context comment:  Ate peanut M&M 18min prior to onset Relieved by:  Nothing Worsened by:  Nothing Ineffective treatments: zyrtec.  No SOB, N/V, HA, CP.   Past Medical History Past Medical History:  Diagnosis Date  . Asthma   . Diabetes mellitus without complication ([place])    not taking metformin anymore, hasn't seen MD for this since 2014  . Hypertension    Patient Active Problem List   Diagnosis Date Noted  . Menometrorrhagia 02/15/2015  . PCOS (polycystic ovarian syndrome) 02/15/2015  . Morbid obesity with BMI of 50.0-59.9, adult ([facility]) 02/15/2015  . Abnormal uterine bleeding 04/25/2013   Home Medication(s) Prior to Admission medications   Medication Sig Start Date End Date Taking? Authorizing Provider  cetirizine HCl (ZYRTEC CHILDRENS ALLERGY) 5 MG/5ML SOLN Take 10 mg by mouth daily.   Yes [provider]  albuterol (PROVENTIL HFA;VENTOLIN HFA) 108 (90 Base) MCG/ACT inhaler Inhale 1 puff into the lungs every 6 (six) hours as needed for wheezing or shortness of breath.    [provider]  diphenhydrAMINE (BENADRYL) 25 MG tablet Take 1 tablet (25 mg total) by mouth every 6 (six) hours for 5 days. 04/18/19 04/23/19  Fatima Blank, MD  EPINEPHrine 0.3 mg/0.3 mL IJ SOAJ injection Inject 0.3 mLs (0.3 mg total) into the muscle as needed for anaphylaxis.  04/18/19   Fatima Blank, MD  famotidine (PEPCID) 20 MG tablet Take 1 tablet (20 mg total) by mouth daily for 5 days. 04/18/19 04/23/19  Fatima Blank, MD  HYDROcodone-acetaminophen (NORCO/VICODIN) 5-325 MG tablet Take 1 tablet by mouth every 8 (eight) hours as needed. Patient not taking: Reported on 04/18/2019 03/23/19   Providence Lanius A, PA-C  predniSONE (DELTASONE) 10 MG tablet Take 4 tablets (40 mg total) by mouth daily for 4 days. 04/18/19 04/22/19  Fatima Blank, MD                                                                                                                                    Past Surgical History Past Surgical History:  Procedure Laterality Date  . NO PAST SURGERIES     Family History Family History  Problem Relation Age of Onset  . Gestational diabetes Mother   . Hypertension Maternal Grandmother     Social History Social History   Tobacco  Use  . Smoking status: Never Smoker  . Smokeless tobacco: Never Used  Substance Use Topics  . Alcohol use: No  . Drug use: No   Allergies Food, Shellfish allergy, and Penicillins  Review of Systems Review of Systems  HENT: Negative for trouble swallowing.   Respiratory: Negative for wheezing.   Skin: Positive for itching and rash.   All other systems are reviewed and are negative for acute change except as noted in the HPI  Physical Exam Vital Signs  I have reviewed the triage vital signs BP 117/75   Pulse (!) 48   Temp 98.2 F (36.8 C) (Oral)   Resp 17   Ht  (1.651 m)   Wt 99.3 kg   SpO2 100%   BMI 36.44 kg/m   Physical Exam Vitals signs reviewed.  Constitutional:      General: She is not in acute distress.    Appearance: She is well-developed. She is obese. She is not diaphoretic.  HENT:     Head: Normocephalic and atraumatic.     Nose: Nose normal.     Mouth/Throat:     Mouth: No angioedema.     Pharynx: No uvula swelling.  Eyes:     General: No scleral  icterus.       Right eye: No discharge.        Left eye: No discharge.     Conjunctiva/sclera: Conjunctivae normal.     Pupils: Pupils are equal, round, and reactive to light.  Neck:     Musculoskeletal: Normal range of motion and neck supple.  Cardiovascular:     Rate and Rhythm: Normal rate and regular rhythm.     Heart sounds: No murmur. No friction rub. No gallop.   Pulmonary:     Effort: Pulmonary effort is normal. No respiratory distress.     Breath sounds: Normal breath sounds. No stridor. No wheezing or rales.  Abdominal:     General: There is no distension.     Palpations: Abdomen is soft.     Tenderness: There is no abdominal tenderness.  Musculoskeletal:        General: No tenderness.  Skin:    General: Skin is warm and dry.     Findings: Rash present. No erythema. Rash is urticarial (to entire body).  Neurological:     Mental Status: She is alert and oriented to person, place, and time.     ED Results and Treatments Labs (all labs ordered are listed, but only abnormal results are displayed) Labs Reviewed  CBG MONITORING, ED                                                                                                                         EKG  EKG Interpretation  Date/Time:    Ventricular Rate:    PR Interval:    QRS Duration:   QT Interval:    QTC Calculation:   R Axis:     Text Interpretation:  Radiology No results found.  Pertinent labs & imaging results that were available during my care of the patient were reviewed by me and considered in my medical decision making (see chart for details).  Medications Ordered in ED Medications  diphenhydrAMINE (BENADRYL) injection 25 mg (25 mg Intravenous Given 04/18/19 0313)  famotidine (PEPCID) IVPB 20 mg premix (0 mg Intravenous Stopped 04/18/19 0350)  predniSONE (DELTASONE) tablet 40 mg (40 mg Oral Given 04/18/19 0346)                                                                                                                                     Procedures Procedures  (including critical care time)  Medical Decision Making / ED Course I have reviewed the nursing notes for this encounter and the patient's prior records (if available in EHR or on provided paperwork).   Delorse Lekntoinette Lose was evaluated in Emergency Department on 04/18/2019 for the symptoms described in the history of present illness. She was evaluated in the context of the global COVID-19 pandemic, which necessitated consideration that the patient might be at risk for infection with the SARS-CoV-2 virus that causes COVID-19. Institutional protocols and algorithms that pertain to the evaluation of patients at risk for COVID-19 are in a state of rapid change based on information released by regulatory bodies including the CDC and federal and state organizations. These policies and algorithms were followed during the patient's care in the ED.  33 y.o. female here with pruritic rash. Likely peanuts. No respiratory, GI, or neurologic symptoms to suggest anaphylaxis. No recent infectious symptoms suggestive of viral urticaria.  Patient has not taken benadryl prior to arrival.   On exam, there is no evidence of oral swelling or airway compromise.   Given Benadryl, H2 blocker, and steroids.   Monitored for 5 hrs. Symptoms without progression. Urticaria resolved.  Safe for discharge with strict return precautions. Given Rx for H2 blocker and steroids.       Final Clinical Impression(s) / ED Diagnoses Final diagnoses:  Allergic reaction, initial encounter     The patient appears reasonably screened and/or stabilized for discharge and I doubt any other medical condition or other [facility] requiring further screening, evaluation, or treatment in the ED at this time prior to discharge.  Disposition: Discharge  Condition: Good  I have discussed the results, Dx and Tx plan with the patient who expressed understanding and agree(s)  with the plan. Discharge instructions discussed at great length. The patient was given strict return precautions who verbalized understanding of the instructions. No further questions at time of discharge.    ED Discharge Orders         Ordered    diphenhydrAMINE (BENADRYL) 25 MG tablet  Every 6 hours     04/18/19 0552    famotidine (PEPCID) 20 MG tablet  Daily     04/18/19 0552    EPINEPHrine 0.3 mg/0.3 mL IJ SOAJ injection  As needed     04/18/19 0552    predniSONE (DELTASONE) 10 MG tablet  Daily,   Status:  Discontinued     04/18/19 0552    predniSONE (DELTASONE) 10 MG tablet  Daily     04/18/19 0553           Follow Up: Primary care provider  Schedule an appointment as soon as possible for a visit       This chart was dictated using voice recognition software.  Despite best efforts to proofread,  errors can occur which can change the documentation meaning.   Nira Conn, MD 04/18/19 0600

## 2019-04-18 NOTE — ED Triage Notes (Signed)
Patient is experiencing an allergic reaction to possibly peanuts. Patient states she ate peanut M&Ms and peanut butter before going to bed. Patient woke up with hives all over and itching. Patient states she took some of her sons allergic medication. No oral swelling noted. Speaking in full sentences.

## 2019-08-25 ENCOUNTER — Other Ambulatory Visit: Payer: Self-pay

## 2019-08-25 ENCOUNTER — Ambulatory Visit
Admission: EM | Admit: 2019-08-25 | Discharge: 2019-08-25 | Disposition: A | Discharge status: Home / Self Care | Payer: BC Managed Care – PPO | Attending: Emergency Medicine | Admitting: Emergency Medicine

## 2019-08-25 DIAGNOSIS — T7840XA Allergy, unspecified, initial encounter: Secondary | ICD-10-CM

## 2019-08-25 MED ORDER — METHYLPREDNISOLONE SODIUM SUCC 125 MG IJ SOLR
125.0000 mg | Freq: Once | INTRAMUSCULAR | Status: AC
  Administered 2019-08-25: 125 mg via INTRAMUSCULAR

## 2019-08-25 MED ORDER — HYDROXYZINE HCL 25 MG PO TABS: 25.0000 mg | ORAL_TABLET | Freq: Four times a day (QID) | ORAL | 0 refills | Status: AC

## 2019-08-25 NOTE — ED Provider Notes (Signed)
[facility]    CSN: [id number] Arrival date & time: 08/25/19  1249      History   Chief Complaint Chief Complaint  Patient presents with  . Allergic Reaction  . Rash    HPI Virginia Davis is a 34 y.o. female with history of hypertension, obesity, asthma presenting for pruritic rash all over her body.  Patient states that she popped some old kettle corn and ate it and shortly developed a rash that was very itchy on her torso, upper and lower extremities.  Patient took Benadryl prior to arriving to urgent care today which helped with reported swelling, rash.  Patient denies visible rash currently, though still has itching.  Denies angioedema, difficulty breathing or swallowing, chest pain, cough.  Patient denies other changes to diet, lifestyle, medications.  Patient states that she is noted more food sensitivity since undergoing bariatric surgery > 2 years ago: Has not yet seen allergy/immunology.     Past Medical History:  Diagnosis Date  . Asthma   . Diabetes mellitus without complication ([place])    not taking metformin anymore, hasn't seen MD for this since 2014  . Hypertension     Patient Active Problem List   Diagnosis Date Noted  . Menometrorrhagia 02/15/2015  . PCOS (polycystic ovarian syndrome) 02/15/2015  . Morbid obesity with BMI of 50.0-59.9, adult ([facility]) 02/15/2015  . Abnormal uterine bleeding 04/25/2013    Past Surgical History:  Procedure Laterality Date  . NO PAST SURGERIES      OB History    Gravida  0   Para  0   Term  0   Preterm  0   AB  0   Living  0     SAB  0   TAB  0   Ectopic  0   Multiple  0   Live Births               Home Medications    Prior to Admission medications   Medication Sig Start Date End Date Taking? Authorizing Provider  albuterol (PROVENTIL HFA;VENTOLIN HFA) 108 (90 Base) MCG/ACT inhaler Inhale 1 puff into the lungs every 6 (six) hours as needed for wheezing or shortness of breath.     [provider]  diphenhydrAMINE (BENADRYL) 25 MG tablet Take 1 tablet (25 mg total) by mouth every 6 (six) hours for 5 days. 04/18/19 04/23/19  Fatima Blank, MD  EPINEPHrine 0.3 mg/0.3 mL IJ SOAJ injection Inject 0.3 mLs (0.3 mg total) into the muscle as needed for anaphylaxis. 04/18/19   Fatima Blank, MD  hydrOXYzine (ATARAX/VISTARIL) 25 MG tablet Take 1 tablet (25 mg total) by mouth every 6 (six) hours. 08/25/19   Hall-Potvin, [place], PA-C    Family History Family History  Problem Relation Age of Onset  . Gestational diabetes Mother   . Hypertension Maternal Grandmother     Social History Social History   Tobacco Use  . Smoking status: Never Smoker  . Smokeless tobacco: Never Used  Substance Use Topics  . Alcohol use: No  . Drug use: No     Allergies   Food, Shellfish allergy, and Penicillins   Review of Systems As per HPI   Physical Exam Triage Vital Signs ED Triage Vitals  Enc Vitals Group     BP      Pulse      Resp      Temp      Temp src      SpO2  Weight      Height      Head Circumference      Peak Flow      Pain Score      Pain Loc      Pain Edu?      Excl. in GC?    No data found.  Updated Vital Signs BP 119/77   Pulse 77   Temp 98.3 F (36.8 C) (Oral)   Resp 17   LMP 07/30/2019 (Exact Date)   SpO2 98%   Visual Acuity Right Eye Distance:   Left Eye Distance:   Bilateral Distance:    Right Eye Near:   Left Eye Near:    Bilateral Near:     Physical Exam Constitutional:      General: She is not in acute distress.    Appearance: She is obese. She is not ill-appearing or diaphoretic.  HENT:     Head: Normocephalic and atraumatic.     Mouth/Throat:     Mouth: Mucous membranes are moist.     Pharynx: Oropharynx is clear.     Comments: Lips and tongue are without edema.  Uvula is midline, rises symmetrically. Eyes:     General: No scleral icterus.    Conjunctiva/sclera: Conjunctivae normal.      Pupils: Pupils are equal, round, and reactive to light.  Neck:     Comments: Trachea midline, negative JVD.  No tracheal stridor Cardiovascular:     Rate and Rhythm: Normal rate and regular rhythm.     Heart sounds: No murmur. No gallop.   Pulmonary:     Effort: Pulmonary effort is normal. No respiratory distress.     Breath sounds: No wheezing.  Musculoskeletal:     Cervical back: Normal range of motion and neck supple. No tenderness.  Lymphadenopathy:     Cervical: No cervical adenopathy.  Skin:    General: Skin is warm.     Capillary Refill: Capillary refill takes less than 2 seconds.     Coloration: Skin is not jaundiced or pale.     Findings: No bruising, erythema or rash.     Comments: No visible rash, the patient provides picture in which there seemed to have been scattered papules without underlying erythema or urticaria.  Neurological:     General: No focal deficit present.     Mental Status: She is alert and oriented to person, place, and time.      UC Treatments / Results  Labs (all labs ordered are listed, but only abnormal results are displayed) Labs Reviewed - No data to display  EKG   Radiology No results found.  Procedures Procedures (including critical care time)  Medications Ordered in UC Medications  methylPREDNISolone sodium succinate (SOLU-MEDROL) 125 mg/2 mL injection 125 mg (125 mg Intramuscular Given 08/25/19 1330)    Initial Impression / Assessment and Plan / UC Course  I have reviewed the triage vital signs and the nursing notes.  Pertinent labs & imaging results that were available during my care of the patient were reviewed by me and considered in my medical decision making (see chart for details).     Patient afebrile, nontoxic in office today.  No acute respiratory distress and symptoms have improved with Benadryl.  Given concern for allergic reaction, patient was given IM Solu-Medrol which she tolerated well.  Will also provide  hydroxyzine for itching and have patient follow-up with allergy/immunology for further evaluation and management given increased dietary reaction status post bariatric surgery.  Patient  also to follow-up with PCP Monday.  Return precautions discussed, patient verbalized understanding and is agreeable to plan. Final Clinical Impressions(s) / UC Diagnoses   Final diagnoses:  Allergic reaction, initial encounter     Discharge Instructions     Important to follow up with allergy doc. Go to ER for worsening itching, swelling, difficulty breathing or swallowing.    ED Prescriptions    Medication Sig Dispense Auth. Provider   hydrOXYzine (ATARAX/VISTARIL) 25 MG tablet Take 1 tablet (25 mg total) by mouth every 6 (six) hours. 12 tablet Hall-Potvin, [place], PA-C     PDMP not reviewed this encounter.   Hall-Potvin, [place], [place] 08/25/19 1526

## 2019-08-25 NOTE — ED Triage Notes (Addendum)
Patient states she was eating kettle corn and shortly afterwards she developed a rash on torso and upper/lower extremities.  She took Children's Benadryl 12.5mg  prior to coming to the UC.

## 2019-08-25 NOTE — Discharge Instructions (Addendum)
Important to follow up with allergy doc. Go to ER for worsening itching, swelling, difficulty breathing or swallowing.

## 2019-09-26 ENCOUNTER — Other Ambulatory Visit: Payer: Self-pay

## 2019-09-26 ENCOUNTER — Ambulatory Visit (HOSPITAL_COMMUNITY)
Admission: EM | Admit: 2019-09-26 | Discharge: 2019-09-26 | Disposition: A | Discharge status: Home / Self Care | Payer: BC Managed Care – PPO | Attending: Family Medicine | Admitting: Family Medicine

## 2019-09-26 ENCOUNTER — Encounter (HOSPITAL_COMMUNITY): Payer: Self-pay | Admitting: Emergency Medicine

## 2019-09-26 DIAGNOSIS — L03119 Cellulitis of unspecified part of limb: Secondary | ICD-10-CM | POA: Diagnosis not present

## 2019-09-26 DIAGNOSIS — L02419 Cutaneous abscess of limb, unspecified: Secondary | ICD-10-CM

## 2019-09-26 MED ORDER — DOXYCYCLINE HYCLATE 100 MG PO CAPS: 100.0000 mg | ORAL_CAPSULE | Freq: Two times a day (BID) | ORAL | 0 refills | Status: DC

## 2019-09-26 NOTE — ED Triage Notes (Signed)
Abscess on left lower leg. PT suspects a spider bite, did not see an insect.   She popped area last night and it drained significantly.

## 2019-09-26 NOTE — Discharge Instructions (Signed)
If not allergic, you may use over the counter acetaminophen as needed for discomfort.

## 2019-09-27 NOTE — ED Provider Notes (Signed)
[facility]   [id number] 09/26/19 Arrival Time: 1803  ASSESSMENT & PLAN:  1. Cellulitis and abscess of leg     Incision and Drainage Procedure Note  Anesthesia: 2% plain lidocaine  Procedure Details  The procedure, risks and complications have been discussed in detail (including, but not limited to pain and bleeding) with the patient.  The skin induration was prepped and draped in the usual fashion. After adequate local anesthesia, a small incision with a #11 scalpel was performed on the induration of her L anterior lower leg with minimal, purulent drainage.  EBL: minimal Drains: none Packing: none Condition: Tolerated procedure well Complications: none.  Begin: Meds ordered this encounter  Medications  . doxycycline (VIBRAMYCIN) 100 MG capsule    Sig: Take 1 capsule (100 mg total) by mouth 2 (two) times daily.    Dispense:  14 capsule    Refill:  0    Wound care instructions discussed and given in written format. To return in 48 hours for wound check if needed.  Finish all antibiotics. OTC analgesics as needed.  Reviewed expectations re: course of current medical issues. Questions answered. Outlined signs and symptoms indicating need for more acute intervention. Patient verbalized understanding. After Visit Summary given.   SUBJECTIVE:  Virginia Davis is a 34 y.o. female who presents with a possible infection of her anterior L lower leg. Onset gradual, approximately several days ago with some drainage yest "when I squeezed it". No bleeding. Symptoms have gradually worsened since beginning. Fever: absent. OTC/home treatment: none reported.   OBJECTIVE:  Vitals:   09/26/19 1834  BP: 109/84  Pulse: 67  Resp: 16  Temp: 98.9 F (37.2 C)  TempSrc: Oral  SpO2: 100%     General appearance: alert; no distress Skin: approx 1 cm induration of her L anterior lower leg; tender to touch; no active drainage or bleeding; mild overlying  erythema Psychological: alert and cooperative; normal mood and affect  Allergies  Allergen Reactions  . Food Anaphylaxis    pistacho  . Shellfish Allergy Anaphylaxis  . Penicillins     Has patient had a PCN reaction causing immediate rash, facial/tongue/throat swelling, SOB or lightheadedness with hypotension: Yes Has patient had a PCN reaction causing severe rash involving mucus membranes or skin necrosis: No Has patient had a PCN reaction that required hospitalization No Has patient had a PCN reaction occurring within the last 10 years: No If all of the above answers are "NO", then may proceed with Cephalosporin use.       Past Medical History:  Diagnosis Date  . Asthma   . Diabetes mellitus without complication (HCC)    not taking metformin anymore, hasn't seen MD for this since 2014  . Hypertension    Social History   Socioeconomic History  . Marital status: Single    Spouse name: Not on file  . Number of children: Not on file  . Years of education: Not on file  . Highest education level: Not on file  Occupational History  . Not on file  Tobacco Use  . Smoking status: Never Smoker  . Smokeless tobacco: Never Used  Substance and Sexual Activity  . Alcohol use: No  . Drug use: No  . Sexual activity: Never  Other Topics Concern  . Not on file  Social History Narrative  . Not on file   Social Determinants of Health   Financial Resource Strain:   . Difficulty of Paying Living Expenses:   Food Insecurity:   .  Worried About Charity fundraiser in the Last Year:   . Arboriculturist in the Last Year:   Transportation Needs:   . Film/video editor (Medical):   Marland Kitchen Lack of Transportation (Non-Medical):   Physical Activity:   . Days of Exercise per Week:   . Minutes of Exercise per Session:   Stress:   . Feeling of Stress :   Social Connections:   . Frequency of Communication with Friends and Family:   . Frequency of Social Gatherings with Friends and  Family:   . Attends Religious Services:   . Active Member of Clubs or Organizations:   . Attends Archivist Meetings:   Marland Kitchen Marital Status:    Family History  Problem Relation Age of Onset  . Gestational diabetes Mother   . Hypertension Maternal Grandmother    Past Surgical History:  Procedure Laterality Date  . NO PAST SURGERIES             Vanessa Kick, MD 09/27/19 [phone]

## 2019-09-30 ENCOUNTER — Telehealth (HOSPITAL_COMMUNITY): Payer: Self-pay | Admitting: Emergency Medicine

## 2019-09-30 MED ORDER — SULFAMETHOXAZOLE-TRIMETHOPRIM 800-160 MG PO TABS: 1.0000 | ORAL_TABLET | Freq: Two times a day (BID) | ORAL | 0 refills | Status: AC

## 2019-09-30 NOTE — Telephone Encounter (Signed)
PT was seen 3/17 and given doxycycline for abscess. She has vomited after taking medicine twice now. She has tried to take with with food, with water and no improvement. Requests medication change.   She is currently on her menstrual cycle.

## 2020-12-12 ENCOUNTER — Emergency Department (HOSPITAL_BASED_OUTPATIENT_CLINIC_OR_DEPARTMENT_OTHER): Payer: Medicaid Other

## 2020-12-12 ENCOUNTER — Emergency Department (HOSPITAL_BASED_OUTPATIENT_CLINIC_OR_DEPARTMENT_OTHER)
Admission: EM | Admit: 2020-12-12 | Discharge: 2020-12-13 | Disposition: A | Discharge status: Home / Self Care | Payer: Medicaid Other | Attending: Emergency Medicine | Admitting: Emergency Medicine

## 2020-12-12 ENCOUNTER — Other Ambulatory Visit: Payer: Self-pay

## 2020-12-12 ENCOUNTER — Encounter (HOSPITAL_BASED_OUTPATIENT_CLINIC_OR_DEPARTMENT_OTHER): Payer: Self-pay | Admitting: *Deleted

## 2020-12-12 DIAGNOSIS — Z20822 Contact with and (suspected) exposure to covid-19: Secondary | ICD-10-CM | POA: Diagnosis not present

## 2020-12-12 DIAGNOSIS — R197 Diarrhea, unspecified: Secondary | ICD-10-CM

## 2020-12-12 DIAGNOSIS — E876 Hypokalemia: Secondary | ICD-10-CM | POA: Diagnosis not present

## 2020-12-12 DIAGNOSIS — R109 Unspecified abdominal pain: Secondary | ICD-10-CM | POA: Diagnosis present

## 2020-12-12 DIAGNOSIS — R1031 Right lower quadrant pain: Secondary | ICD-10-CM

## 2020-12-12 DIAGNOSIS — I10 Essential (primary) hypertension: Secondary | ICD-10-CM | POA: Diagnosis not present

## 2020-12-12 DIAGNOSIS — E119 Type 2 diabetes mellitus without complications: Secondary | ICD-10-CM | POA: Insufficient documentation

## 2020-12-12 DIAGNOSIS — N739 Female pelvic inflammatory disease, unspecified: Secondary | ICD-10-CM

## 2020-12-12 DIAGNOSIS — D649 Anemia, unspecified: Secondary | ICD-10-CM | POA: Insufficient documentation

## 2020-12-12 DIAGNOSIS — J45909 Unspecified asthma, uncomplicated: Secondary | ICD-10-CM | POA: Insufficient documentation

## 2020-12-12 LAB — PREGNANCY, URINE: Preg Test, Ur: NEGATIVE

## 2020-12-12 LAB — CBC WITH DIFFERENTIAL/PLATELET
Abs Immature Granulocytes: 0.01 10*3/uL (ref 0.00–0.07)
Basophils Absolute: 0 10*3/uL (ref 0.0–0.1)
Basophils Relative: 1 %
Eosinophils Absolute: 0.1 10*3/uL (ref 0.0–0.5)
Eosinophils Relative: 1 %
HCT: 35.8 % — ABNORMAL LOW (ref 36.0–46.0)
Hemoglobin: 11.2 g/dL — ABNORMAL LOW (ref 12.0–15.0)
Immature Granulocytes: 0 %
Lymphocytes Relative: 29 %
Lymphs Abs: 1.6 10*3/uL (ref 0.7–4.0)
MCH: 26 pg (ref 26.0–34.0)
MCHC: 31.3 g/dL (ref 30.0–36.0)
MCV: 83.3 fL (ref 80.0–100.0)
Monocytes Absolute: 0.4 10*3/uL (ref 0.1–1.0)
Monocytes Relative: 8 %
Neutro Abs: 3.3 10*3/uL (ref 1.7–7.7)
Neutrophils Relative %: 61 %
Platelets: 274 10*3/uL (ref 150–400)
RBC: 4.3 MIL/uL (ref 3.87–5.11)
RDW: 14.9 % (ref 11.5–15.5)
WBC: 5.4 10*3/uL (ref 4.0–10.5)
nRBC: 0 % (ref 0.0–0.2)

## 2020-12-12 LAB — COMPREHENSIVE METABOLIC PANEL
ALT: 11 U/L (ref 0–44)
AST: 19 U/L (ref 15–41)
Albumin: 3.7 g/dL (ref 3.5–5.0)
Alkaline Phosphatase: 62 U/L (ref 38–126)
Anion gap: 10 (ref 5–15)
BUN: 10 mg/dL (ref 6–20)
CO2: 24 mmol/L (ref 22–32)
Calcium: 8.9 mg/dL (ref 8.9–10.3)
Chloride: 104 mmol/L (ref 98–111)
Creatinine, Ser: 0.76 mg/dL (ref 0.44–1.00)
GFR, Estimated: >60 mL/min (ref >60)
Glucose, Bld: 90 mg/dL (ref 70–99)
Potassium: 3.2 mmol/L — ABNORMAL LOW (ref 3.5–5.1)
Sodium: 138 mmol/L (ref 135–145)
Total Bilirubin: 0.2 mg/dL — ABNORMAL LOW (ref 0.3–1.2)
Total Protein: 7.8 g/dL (ref 6.5–8.1)

## 2020-12-12 LAB — WET PREP, GENITAL
Clue Cells Wet Prep HPF POC: NONE SEEN
Sperm: NONE SEEN
Trich, Wet Prep: NONE SEEN
Yeast Wet Prep HPF POC: NONE SEEN

## 2020-12-12 LAB — URINALYSIS, MICROSCOPIC (REFLEX)

## 2020-12-12 LAB — URINALYSIS, ROUTINE W REFLEX MICROSCOPIC
Bilirubin Urine: NEGATIVE
Glucose, UA: NEGATIVE mg/dL
Ketones, ur: 15 mg/dL — AB
Leukocytes,Ua: NEGATIVE
Nitrite: NEGATIVE
Protein, ur: NEGATIVE mg/dL
Specific Gravity, Urine: >1.03 — ABNORMAL HIGH (ref 1.005–1.030)
pH: 5.5 (ref 5.0–8.0)

## 2020-12-12 LAB — LIPASE, BLOOD: Lipase: 26 U/L (ref 11–51)

## 2020-12-12 MED ORDER — ONDANSETRON HCL 4 MG/2ML IJ SOLN
4.0000 mg | Freq: Once | INTRAMUSCULAR | Status: AC
  Administered 2020-12-12: 4 mg via INTRAVENOUS
  Filled 2020-12-12: qty 2

## 2020-12-12 MED ORDER — GENTAMICIN SULFATE 40 MG/ML IJ SOLN
240.0000 mg | Freq: Once | INTRAMUSCULAR | Status: AC
  Administered 2020-12-12: 240 mg via INTRAMUSCULAR
  Filled 2020-12-12: qty 6

## 2020-12-12 MED ORDER — AZITHROMYCIN 250 MG PO TABS
2000.0000 mg | ORAL_TABLET | Freq: Once | ORAL | Status: AC
  Administered 2020-12-13: 2000 mg via ORAL
  Filled 2020-12-12: qty 8

## 2020-12-12 MED ORDER — SODIUM CHLORIDE 0.9 % IV BOLUS
1000.0000 mL | Freq: Once | INTRAVENOUS | Status: AC
  Administered 2020-12-12: 1000 mL via INTRAVENOUS

## 2020-12-12 MED ORDER — MORPHINE SULFATE (PF) 4 MG/ML IV SOLN
4.0000 mg | Freq: Once | INTRAVENOUS | Status: AC
  Administered 2020-12-12: 4 mg via INTRAVENOUS
  Filled 2020-12-12: qty 1

## 2020-12-12 MED ORDER — POTASSIUM CHLORIDE CRYS ER 20 MEQ PO TBCR
40.0000 meq | EXTENDED_RELEASE_TABLET | Freq: Once | ORAL | Status: AC
  Administered 2020-12-12: 40 meq via ORAL
  Filled 2020-12-12: qty 2

## 2020-12-12 MED ORDER — ONDANSETRON 4 MG PO TBDP: 4.0000 mg | ORAL_TABLET | Freq: Three times a day (TID) | ORAL | 0 refills | Status: AC | PRN

## 2020-12-12 MED ORDER — METRONIDAZOLE 500 MG PO TABS: 500.0000 mg | ORAL_TABLET | Freq: Two times a day (BID) | ORAL | 0 refills | Status: AC

## 2020-12-12 MED ORDER — DOXYCYCLINE HYCLATE 100 MG PO CAPS: 100.0000 mg | ORAL_CAPSULE | Freq: Two times a day (BID) | ORAL | 0 refills | Status: AC

## 2020-12-12 NOTE — Discharge Instructions (Addendum)
As we discussed today your wet prep, the swab from inside your vagina, was showing many white blood cells which can be an indicator of infection.  Given the pain that you had on pelvic exam you have been given a prescription for antibiotics.  You have test for gonorrhea and chlamydia pending along with a COVID test. Please follow-up with all of these results in MyChart.  You Your urine did have bacteria in it however given that you are not having any specific urinary symptoms will send for culture  I would recommend following up with a primary care doctor. If you are unable to tolerate food/water, Develop fevers, or have other concerns please seek additional medical care and evaluation.  Today you received medications that may make you sleepy or impair your ability to make decisions.  For the next 24 hours please do not drive, operate heavy machinery, care for a small child with out another adult present, or perform any activities that may cause harm to you or someone else if you were to fall asleep or be impaired.   Please do not drink alcohol while you are taking Flagyl.  If you drink alcohol while taking this medicine it will cause you to become violently ill.  You may have diarrhea from the antibiotics.  It is very important that you continue to take the antibiotics even if you get diarrhea unless a medical professional tells you that you may stop taking them.  If you stop too early the bacteria you are being treated for will become stronger and you may need different, more powerful antibiotics that have more side effects and worsening diarrhea.  Please stay well hydrated and consider probiotics as they may decrease the severity of your diarrhea.  Please be aware that if you take any hormonal contraception (birth control pills, nexplanon, the ring, etc) that your birth control will not work while you are taking antibiotics and you need to use back up protection as directed on the birth control  medication information insert.   Please do not have any sexual contact until you finish all your antibiotics.

## 2020-12-12 NOTE — ED Triage Notes (Signed)
C/o lower abd pain n/d x 3 days

## 2020-12-12 NOTE — ED Provider Notes (Signed)
[facility] EMERGENCY DEPARTMENT Provider Note   CSN: [id number] Arrival date & time: 12/12/20  1920     History Chief Complaint  Patient presents with  . Abdominal Pain    Virginia Davis is a 35 y.o. female with past medical history of diabetes, hypertension, who presents today for evaluation of 48 hours of feeling unwell. She states that on Wednesday she originally thought she might have COVID as she had a temperature of 100.8, body aches and headache. She works in a daycare. She took a COVID test and that was negative.  She has had 2 vaccines. She states that those symptoms resolved and then yesterday she began having diarrhea. She states that she has had significant constant diarrhea.  She states as far as s he knows none of the kids at daycare have had diarrhea.   She denies any concern for STI.  No abnormal discharge.   HPI     Past Medical History:  Diagnosis Date  . Asthma   . Diabetes mellitus without complication (HCC)    not taking metformin anymore, hasn't seen MD for this since 2014  . Hypertension     Patient Active Problem List   Diagnosis Date Noted  . Menometrorrhagia 02/15/2015  . PCOS (polycystic ovarian syndrome) 02/15/2015  . Morbid obesity with BMI of 50.0-59.9, adult (HCC) 02/15/2015  . Abnormal uterine bleeding 04/25/2013    Past Surgical History:  Procedure Laterality Date  . NO PAST SURGERIES    . SLEEVE GASTROPLASTY       OB History    Gravida  0   Para  0   Term  0   Preterm  0   AB  0   Living  0     SAB  0   IAB  0   Ectopic  0   Multiple  0   Live Births              Family History  Problem Relation Age of Onset  . Gestational diabetes Mother   . Hypertension Maternal Grandmother     Social History   Tobacco Use  . Smoking status: Never Smoker  . Smokeless tobacco: Never Used  Vaping Use  . Vaping Use: Never used  Substance Use Topics  . Alcohol use: No  . Drug use: No    Home  Medications Prior to Admission medications   Medication Sig Start Date End Date Taking? Authorizing Provider  doxycycline (VIBRAMYCIN) 100 MG capsule Take 1 capsule (100 mg total) by mouth 2 (two) times daily for 14 days. 12/12/20 12/26/20 Yes Cristina Gong, PA-C  metroNIDAZOLE (FLAGYL) 500 MG tablet Take 1 tablet (500 mg total) by mouth 2 (two) times daily for 14 days. 12/12/20 12/26/20 Yes Cristina Gong, PA-C  ondansetron (ZOFRAN ODT) 4 MG disintegrating tablet Take 1 tablet (4 mg total) by mouth every 8 (eight) hours as needed for nausea or vomiting. 12/12/20  Yes Cristina Gong, PA-C  albuterol (PROVENTIL HFA;VENTOLIN HFA) 108 (90 Base) MCG/ACT inhaler Inhale 1 puff into the lungs every 6 (six) hours as needed for wheezing or shortness of breath.    [provider]  diphenhydrAMINE (BENADRYL) 25 MG tablet Take 1 tablet (25 mg total) by mouth every 6 (six) hours for 5 days. 04/18/19 04/23/19  Nira Conn, MD  EPINEPHrine 0.3 mg/0.3 mL IJ SOAJ injection Inject 0.3 mLs (0.3 mg total) into the muscle as needed for anaphylaxis. 04/18/19   Cardama, Amadeo Garnet,  MD  hydrOXYzine (ATARAX/VISTARIL) 25 MG tablet Take 1 tablet (25 mg total) by mouth every 6 (six) hours. 08/25/19   Hall-Potvin, [place], PA-C    Allergies    Food, Shellfish allergy, and Penicillins  Review of Systems   Review of Systems  Constitutional: Positive for chills, fatigue and fever.  Respiratory: Negative for cough and shortness of breath.   Gastrointestinal: Positive for abdominal pain, diarrhea and nausea. Negative for blood in stool and vomiting.  Genitourinary: Negative for dysuria, frequency and urgency.  Musculoskeletal: Positive for arthralgias and myalgias.  Neurological: Positive for headaches. Negative for weakness.  All other systems reviewed and are negative.   Physical Exam Updated Vital Signs BP (!) 115/55 (BP Location: Left Arm)   Pulse (!) 54   Temp 98.7 F (37.1 C)  (Oral)   Resp 20   Ht 5\' 5"  (1.651 m)   Wt 114.3 kg   LMP 12/03/2020   SpO2 100%   BMI 41.93 kg/m   Physical Exam Vitals and nursing note reviewed.  Constitutional:      Appearance: She is not diaphoretic.     Comments: Appears uncomfortable.  HENT:     Head: Normocephalic and atraumatic.  Eyes:     General: No scleral icterus.       Right eye: No discharge.        Left eye: No discharge.     Conjunctiva/sclera: Conjunctivae normal.  Cardiovascular:     Rate and Rhythm: Normal rate and regular rhythm.  Pulmonary:     Effort: Pulmonary effort is normal. No respiratory distress.     Breath sounds: No stridor.  Abdominal:     General: There is no distension.     Palpations: Abdomen is soft.     Tenderness: There is abdominal tenderness in the right lower quadrant. There is guarding and rebound.     Hernia: No hernia is present.  Musculoskeletal:        General: No deformity.     Cervical back: Normal range of motion.  Skin:    General: Skin is warm and dry.  Neurological:     Mental Status: She is alert.     Motor: No abnormal muscle tone.     Comments: Patient is awake and alert, answers questions appropriately.  Speech is nonslurred.  Psychiatric:        Behavior: Behavior normal.     ED Results / Procedures / Treatments   Labs (all labs ordered are listed, but only abnormal results are displayed) Labs Reviewed  WET PREP, GENITAL - Abnormal; Notable for the following components:      Result Value   WBC, Wet Prep HPF POC MODERATE (*)    All other components within normal limits  COMPREHENSIVE METABOLIC PANEL - Abnormal; Notable for the following components:   Potassium 3.2 (*)    Total Bilirubin 0.2 (*)    All other components within normal limits  CBC WITH DIFFERENTIAL/PLATELET - Abnormal; Notable for the following components:   Hemoglobin 11.2 (*)    HCT 35.8 (*)    All other components within normal limits  URINALYSIS, ROUTINE W REFLEX MICROSCOPIC -  Abnormal; Notable for the following components:   Specific Gravity, Urine >1.030 (*)    Hgb urine dipstick LARGE (*)    Ketones, ur 15 (*)    All other components within normal limits  URINALYSIS, MICROSCOPIC (REFLEX) - Abnormal; Notable for the following components:   Bacteria, UA MANY (*)    All  other components within normal limits  URINE CULTURE  SARS CORONAVIRUS 2 (TAT 6-24 HRS)  LIPASE, BLOOD  PREGNANCY, URINE  GC/CHLAMYDIA PROBE AMP ([place]    EKG None  Radiology CT ABDOMEN PELVIS WO CONTRAST  Result Date: 12/12/2020 CLINICAL DATA:  Right lower quadrant pain, nausea, and diarrhea for 3 days. EXAM: CT ABDOMEN AND PELVIS WITHOUT CONTRAST TECHNIQUE: Multidetector CT imaging of the abdomen and pelvis was performed following the standard protocol without IV contrast. COMPARISON:  03/03/2019 FINDINGS: Lower chest: No acute findings. Hepatobiliary: No mass visualized on this unenhanced exam. Gallbladder is unremarkable. No evidence of biliary ductal dilatation. Pancreas: No mass or inflammatory process visualized on this unenhanced exam. Spleen:  Within normal limits in size. Adrenals/Urinary tract: No evidence of urolithiasis or hydronephrosis. Unremarkable unopacified urinary bladder. 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 No evidence of obstruction, inflammatory process, or abnormal fluid collections. Normal appendix visualized. Vascular/Lymphatic: No pathologically enlarged lymph nodes identified. No evidence of abdominal aortic aneurysm. Reproductive:  No mass or other significant abnormality. Other:  None. Musculoskeletal:  No suspicious bone lesions identified. IMPRESSION: Unchanged exam.  No acute findings. Electronically Signed   By: Danae OrleansJohn A Stahl M.D.   On: 12/12/2020 21:12    Procedures Procedures   Medications Ordered in ED Medications  sodium chloride 0.9 % bolus 1,000 mL (0 mLs Intravenous Stopped 12/12/20 2240)  morphine 4  MG/ML injection 4 mg (4 mg Intravenous Given 12/12/20 2039)  ondansetron (ZOFRAN) injection 4 mg (4 mg Intravenous Given 12/12/20 2043)  potassium chloride SA (KLOR-CON) CR tablet 40 mEq (40 mEq Oral Given 12/12/20 2240)  gentamicin (GARAMYCIN) injection 240 mg (240 mg Intramuscular Given 12/12/20 2355)  azithromycin (ZITHROMAX) tablet 2,000 mg (2,000 mg Oral Given 12/13/20 0004)    ED Course  I have reviewed the triage vital signs and the nursing notes.  Pertinent labs & imaging results that were available during my care of the patient were reviewed by me and considered in my medical decision making (see chart for details).  Clinical Course as of 12/13/20 0022  Caleen EssexFri Dec 12, 2020  2214 Pelvic exam performed with female chaperone RN in room.  Patient did not tolerate speculum exam. She does have right-sided adnexal tenderness.  No cervical motion fullness or tenderness. [EH]  2306 Bacteria, UA(!): MANY [EH]  2306 WBC, Wet Prep HPF POC(!): MODERATE [EH]    Clinical Course User Index [EH] Norman ClayHammond, Leyton Magoon W, PA-C   MDM Rules/Calculators/A&P                         Patient is a 35 year old woman who presents today for evaluation of abdominal pain with nausea and diarrhea.  Reports primarily upper abdominal pain however on my exam area of most pain in his right lower abdomen on initial exam she appears uncomfortable.  CBC shows mild anemia, no leukocytosis.  CMP shows mild hypokalemia with potassium of 3.2, this is orally repleted and I suspect is secondary to GI losses and poor p.o. intake however otherwise CMP is normal.  UA does show many bacteria, however patient is not having any significant urinary symptoms.  Will send for culture. Given her pain in the right lower abdomen concern for appendicitis, CT scan is obtained without acute abnormalities found. CT scan was obtained without contrast given the setting of a IV CT contrast shortage. Pelvic exam is performed, patient does have right adnexal  tenderness.  Wet prep shows moderate white  blood cells without yeast, trichomoniasis, clue cells. Given her adnexal pain and tenderness and degree of her pain we will treat for PID to be conservative especially as she does not have close follow-up already established. She is given prescriptions for doxycycline and Flagyl, treated here with azithromycin and IM gentamicin due to her penicillin allergy. Additionally she was given 1 L of IV fluids, morphine and Zofran after which she was able to p.o. challenge.  She has test for gonorrhea and chlamydia pending.  She is aware that she should abstain from sexual contact, and that she can follow these results, and her COVID test in MyChart.   Return precautions were discussed with patient who states their understanding.  At the time of discharge patient denied any unaddressed complaints or concerns.  Patient is agreeable for discharge home.  Note: Portions of this report may have been transcribed using voice recognition software. Every effort was made to ensure accuracy; however, inadvertent computerized transcription errors may be present   Final Clinical Impression(s) / ED Diagnoses Final diagnoses:  Right lower quadrant abdominal pain  Diarrhea, unspecified type  Pelvic inflammatory disease (PID)    Rx / DC Orders ED Discharge Orders         Ordered    metroNIDAZOLE (FLAGYL) 500 MG tablet  2 times daily        12/12/20 2309    doxycycline (VIBRAMYCIN) 100 MG capsule  2 times daily        12/12/20 2309    ondansetron (ZOFRAN ODT) 4 MG disintegrating tablet  Every 8 hours PRN        12/12/20 2311           Cristina Gong, PA-C 12/13/20 0025    Virgina Norfolk, DO 12/13/20 1824

## 2020-12-13 LAB — SARS CORONAVIRUS 2 (TAT 6-24 HRS): SARS Coronavirus 2: NEGATIVE

## 2020-12-14 LAB — URINE CULTURE

## 2020-12-15 LAB — GC/CHLAMYDIA PROBE AMP (~~LOC~~) NOT AT ARMC
Chlamydia: NEGATIVE
Comment: NEGATIVE
Comment: NORMAL
Neisseria Gonorrhea: NEGATIVE

## 2021-09-15 ENCOUNTER — Other Ambulatory Visit: Payer: Self-pay

## 2021-09-15 ENCOUNTER — Encounter (HOSPITAL_COMMUNITY): Payer: Self-pay | Admitting: Emergency Medicine

## 2021-09-15 ENCOUNTER — Ambulatory Visit (HOSPITAL_COMMUNITY)
Admission: EM | Admit: 2021-09-15 | Discharge: 2021-09-15 | Disposition: A | Discharge status: Home / Self Care | Payer: Medicaid Other | Attending: Nurse Practitioner | Admitting: Nurse Practitioner

## 2021-09-15 DIAGNOSIS — L02211 Cutaneous abscess of abdominal wall: Secondary | ICD-10-CM

## 2021-09-15 MED ORDER — DOXYCYCLINE HYCLATE 100 MG PO CAPS: 100.0000 mg | ORAL_CAPSULE | Freq: Two times a day (BID) | ORAL | 0 refills | Status: AC

## 2021-09-15 MED ORDER — LIDOCAINE-EPINEPHRINE 1 %-1:100000 IJ SOLN
INTRAMUSCULAR | Status: AC
  Filled 2021-09-15: qty 1

## 2021-09-15 NOTE — ED Triage Notes (Signed)
Concerned for abscess on stomach.   ?Saturday noticed bump, has a weeping wound on right abdomen, warmth and redness around site ?

## 2021-09-15 NOTE — ED Provider Notes (Signed)
?[facility] ? ? ? ?CSN: [id number] ?Arrival date & time: 09/15/21  0807 ? ? ?  ? ?History   ?Chief Complaint ?Chief Complaint  ?Patient presents with  ? Abscess  ? ? ?HPI ?Virginia Davis is a 36 y.o. female.  ? ?Patient reports she was sick over the weekend from a stomach bug, she thinks she caught it from her place of work; she works at a daycare.  She noticed a small red area popping up on her right lower abdominal wall over the weekend and reports this area started to drain today.  The area is painful.  She does think she may have had a fever over the weekend.  She denies nausea/vomiting, changes in mental status, and decreased appetite.   ? ? ?Past Medical History:  ?Diagnosis Date  ? Asthma   ? Diabetes mellitus without complication (HCC)   ? not taking metformin anymore, hasn't seen MD for this since 2014  ? Hypertension   ? ? ?Patient Active Problem List  ? Diagnosis Date Noted  ? Menometrorrhagia 02/15/2015  ? PCOS (polycystic ovarian syndrome) 02/15/2015  ? Morbid obesity with BMI of 50.0-59.9, adult (HCC) 02/15/2015  ? Abnormal uterine bleeding 04/25/2013  ? ? ?Past Surgical History:  ?Procedure Laterality Date  ? NO PAST SURGERIES    ? SLEEVE GASTROPLASTY    ? ? ?OB History   ? ? Gravida  ?0  ? Para  ?0  ? Term  ?0  ? Preterm  ?0  ? AB  ?0  ? Living  ?0  ?  ? ? SAB  ?0  ? IAB  ?0  ? Ectopic  ?0  ? Multiple  ?0  ? Live Births  ?   ?   ?  ?  ? ? ? ?Home Medications   ? ?Prior to Admission medications   ?Medication Sig Start Date End Date Taking? Authorizing Provider  ?doxycycline (VIBRAMYCIN) 100 MG capsule Take 1 capsule (100 mg total) by mouth 2 (two) times daily for 7 days. 09/15/21 09/22/21 Yes Valentino Nose, NP  ?albuterol (PROVENTIL HFA;VENTOLIN HFA) 108 (90 Base) MCG/ACT inhaler Inhale 1 puff into the lungs every 6 (six) hours as needed for wheezing or shortness of breath.    [provider]  ?diphenhydrAMINE (BENADRYL) 25 MG tablet Take 1 tablet (25 mg total) by mouth every  6 (six) hours for 5 days. ?Patient not taking: Reported on 09/15/2021 04/18/19 04/23/19  Nira Conn, MD  ?EPINEPHrine 0.3 mg/0.3 mL IJ SOAJ injection Inject 0.3 mLs (0.3 mg total) into the muscle as needed for anaphylaxis. 04/18/19   Nira Conn, MD  ?hydrOXYzine (ATARAX/VISTARIL) 25 MG tablet Take 1 tablet (25 mg total) by mouth every 6 (six) hours. ?Patient not taking: Reported on 09/15/2021 08/25/19   Hall-Potvin, [place], PA-C  ?ondansetron (ZOFRAN ODT) 4 MG disintegrating tablet Take 1 tablet (4 mg total) by mouth every 8 (eight) hours as needed for nausea or vomiting. ?Patient not taking: Reported on 09/15/2021 12/12/20   Cristina Gong, PA-C  ? ? ?Family History ?Family History  ?Problem Relation Age of Onset  ? Gestational diabetes Mother   ? Hypertension Maternal Grandmother   ? ? ?Social History ?Social History  ? ?Tobacco Use  ? Smoking status: Never  ? Smokeless tobacco: Never  ?Vaping Use  ? Vaping Use: Never used  ?Substance Use Topics  ? Alcohol use: No  ? Drug use: No  ? ? ? ?Allergies   ?Food,  Shellfish allergy, and Penicillins ? ? ?Review of Systems ?Review of Systems ?Per HPI ? ?Physical Exam ?Triage Vital Signs ?ED Triage Vitals  ?Enc Vitals Group  ?   BP 09/15/21 0833 109/75  ?   Pulse Rate 09/15/21 0833 78  ?   Resp 09/15/21 0833 20  ?   Temp 09/15/21 0833 99.6 ?F (37.6 ?C)  ?   Temp Source 09/15/21 0833 Oral  ?   SpO2 09/15/21 0833 100 %  ?   Weight --   ?   Height --   ?   Head Circumference --   ?   Peak Flow --   ?   Pain Score 09/15/21 0830 10  ?   Pain Loc --   ?   Pain Edu? --   ?   Excl. in GC? --   ? ?No data found. ? ?Updated Vital Signs ?BP 109/75 (BP Location: Right Arm)   Pulse 78   Temp 99.6 ?F (37.6 ?C) (Oral)   Resp 20   LMP 08/31/2021 (Approximate)   SpO2 100%  ? ?Visual Acuity ?Right Eye Distance:   ?Left Eye Distance:   ?Bilateral Distance:   ? ?Right Eye Near:   ?Left Eye Near:    ?Bilateral Near:    ? ?Physical Exam ?Vitals and nursing note reviewed.   ?Constitutional:   ?   General: She is not in acute distress. ?   Appearance: Normal appearance. She is not toxic-appearing.  ?Eyes:  ?   General: No scleral icterus. ?   Extraocular Movements: Extraocular movements intact.  ?Skin: ?   General: Skin is warm and dry.  ?   Coloration: Skin is not jaundiced or pale.  ?   Findings: Abscess and erythema present.  ? ?    ?   Comments: Approximately 3 cm x 2 cm erythematous, fluctuant area with surrounding erythema extending ~ 5 cm.  There is oozing from the wound.   ?Neurological:  ?   Mental Status: She is alert and oriented to person, place, and time.  ?   Motor: No weakness.  ?   Gait: Gait normal.  ?Psychiatric:     ?   Mood and Affect: Mood normal.     ?   Behavior: Behavior normal.     ?   Thought Content: Thought content normal.     ?   Judgment: Judgment normal.  ? ? ? ?UC Treatments / Results  ?Labs ?(all labs ordered are listed, but only abnormal results are displayed) ?Labs Reviewed - No data to display ? ?EKG ? ? ?Radiology ?No results found. ? ?Procedures ?Incision and Drainage ? ?Date/Time: 09/15/2021 9:32 AM ?Performed by: Valentino Nose, NP ?Authorized by: Valentino Nose, NP  ? ?Consent:  ?  Consent obtained:  Verbal ?  Consent given by:  Patient ?  Risks, benefits, and alternatives were discussed: yes   ?  Risks discussed:  Bleeding, incomplete drainage and pain ?  Alternatives discussed:  Delayed treatment and alternative treatment ?Universal protocol:  ?  Procedure explained and questions answered to patient or proxy's satisfaction: yes   ?  Patient identity confirmed:  Verbally with patient ?Location:  ?  Type:  Abscess ?  Size:  2 cm x 3 cm ?  Location:  Trunk ?  Trunk location:  Abdomen ?Pre-procedure details:  ?  Skin preparation:  Chlorhexidine ?Sedation:  ?  Sedation type:  None ?Anesthesia:  ?  Anesthesia method:  Local infiltration ?  Local anesthetic:  Lidocaine 1% WITH epi ?Procedure type:  ?  Complexity:  Simple ?Procedure details:   ?  Ultrasound guidance: no   ?  Needle aspiration: no   ?  Incision types:  Single straight ?  Incision depth:  Dermal ?  Wound management:  Probed and deloculated ?  Drainage:  Bloody ?  Drainage amount:  Scant ?  Wound treatment:  Wound left open ?  Packing materials:  None ?Post-procedure details:  ?  Procedure completion:  Tolerated well, no immediate complications (including critical care time) ? ?Medications Ordered in UC ?Medications - No data to display ? ?Initial Impression / Assessment and Plan / UC Course  ?I have reviewed the triage vital signs and the nursing notes. ? ?Pertinent labs & imaging results that were available during my care of the patient were reviewed by me and considered in my medical decision making (see chart for details). ? ?  ?I&D of abscess as above.  Small amount serosanguinous drainage removed from wound.  Start doxycycline 100 mg twice daily for 7 days.  Can use warm compresses and Tylenol/ibuprofen for pain.  We discussed if symptoms worsen in meantime, go to ER.   ?Final Clinical Impressions(s) / UC Diagnoses  ? ?Final diagnoses:  ?Abdominal wall abscess  ? ? ? ?Discharge Instructions   ? ?  ?Please take the entire course of antibiotics.  You can use warm compresses to help draw the infection out further.  Take Tylenol/ibuprofen for the pain.  If your symptoms worsen, please go to the Emergency Room.   ? ? ? ?ED Prescriptions   ? ? Medication Sig Dispense Auth. Provider  ? doxycycline (VIBRAMYCIN) 100 MG capsule Take 1 capsule (100 mg total) by mouth 2 (two) times daily for 7 days. 14 capsule Valentino Nose, NP  ? ?  ? ?PDMP not reviewed this encounter. ?  ?Valentino Nose, NP ?09/15/21 0935 ? ?

## 2021-09-15 NOTE — Discharge Instructions (Signed)
Please take the entire course of antibiotics.  You can use warm compresses to help draw the infection out further.  Take Tylenol/ibuprofen for the pain.  If your symptoms worsen, please go to the Emergency Room.   ?

## 2021-09-16 ENCOUNTER — Encounter (HOSPITAL_COMMUNITY): Payer: Self-pay

## 2021-09-16 ENCOUNTER — Emergency Department (HOSPITAL_COMMUNITY)
Admission: EM | Admit: 2021-09-16 | Discharge: 2021-09-16 | Disposition: A | Discharge status: Home / Self Care | Payer: Medicaid Other | Attending: Emergency Medicine | Admitting: Emergency Medicine

## 2021-09-16 ENCOUNTER — Emergency Department (HOSPITAL_COMMUNITY): Payer: Medicaid Other

## 2021-09-16 DIAGNOSIS — L0390 Cellulitis, unspecified: Secondary | ICD-10-CM

## 2021-09-16 DIAGNOSIS — L02211 Cutaneous abscess of abdominal wall: Secondary | ICD-10-CM

## 2021-09-16 DIAGNOSIS — L03311 Cellulitis of abdominal wall: Secondary | ICD-10-CM | POA: Insufficient documentation

## 2021-09-16 LAB — BASIC METABOLIC PANEL
Anion gap: 11 (ref 5–15)
BUN: <5 mg/dL — ABNORMAL LOW (ref 6–20)
CO2: 25 mmol/L (ref 22–32)
Calcium: 8.6 mg/dL — ABNORMAL LOW (ref 8.9–10.3)
Chloride: 102 mmol/L (ref 98–111)
Creatinine, Ser: 0.67 mg/dL (ref 0.44–1.00)
GFR, Estimated: >60 mL/min (ref >60)
Glucose, Bld: 80 mg/dL (ref 70–99)
Potassium: 2.8 mmol/L — ABNORMAL LOW (ref 3.5–5.1)
Sodium: 138 mmol/L (ref 135–145)

## 2021-09-16 LAB — CBC WITH DIFFERENTIAL/PLATELET
Abs Immature Granulocytes: 0 10*3/uL (ref 0.00–0.07)
Basophils Absolute: 0.1 10*3/uL (ref 0.0–0.1)
Basophils Relative: 1 %
Eosinophils Absolute: 0 10*3/uL (ref 0.0–0.5)
Eosinophils Relative: 0 %
HCT: 31.1 % — ABNORMAL LOW (ref 36.0–46.0)
Hemoglobin: 9.3 g/dL — ABNORMAL LOW (ref 12.0–15.0)
Lymphocytes Relative: 36 %
Lymphs Abs: 2.4 10*3/uL (ref 0.7–4.0)
MCH: 23.6 pg — ABNORMAL LOW (ref 26.0–34.0)
MCHC: 29.9 g/dL — ABNORMAL LOW (ref 30.0–36.0)
MCV: 78.9 fL — ABNORMAL LOW (ref 80.0–100.0)
Monocytes Absolute: 0.1 10*3/uL (ref 0.1–1.0)
Monocytes Relative: 1 %
Neutro Abs: 4.1 10*3/uL (ref 1.7–7.7)
Neutrophils Relative %: 62 %
Platelets: 269 10*3/uL (ref 150–400)
RBC: 3.94 MIL/uL (ref 3.87–5.11)
RDW: 15.9 % — ABNORMAL HIGH (ref 11.5–15.5)
WBC: 6.6 10*3/uL (ref 4.0–10.5)
nRBC: 0 % (ref 0.0–0.2)
nRBC: 1 /100 WBC — ABNORMAL HIGH

## 2021-09-16 LAB — I-STAT BETA HCG BLOOD, ED (MC, WL, AP ONLY): I-stat hCG, quantitative: <5 m[IU]/mL (ref <5)

## 2021-09-16 LAB — LACTIC ACID, PLASMA
Lactic Acid, Venous: 1 mmol/L (ref 0.5–1.9)
Lactic Acid, Venous: 1.9 mmol/L (ref 0.5–1.9)

## 2021-09-16 MED ORDER — MUPIROCIN CALCIUM 2 % EX CREA: 1.0000 "application " | TOPICAL_CREAM | Freq: Two times a day (BID) | CUTANEOUS | 0 refills | Status: AC

## 2021-09-16 MED ORDER — KETOROLAC TROMETHAMINE 15 MG/ML IJ SOLN
15.0000 mg | Freq: Once | INTRAMUSCULAR | Status: AC
  Administered 2021-09-16: 15 mg via INTRAVENOUS
  Filled 2021-09-16: qty 1

## 2021-09-16 MED ORDER — CLINDAMYCIN HCL 300 MG PO CAPS: 300.0000 mg | ORAL_CAPSULE | Freq: Four times a day (QID) | ORAL | 0 refills | Status: AC

## 2021-09-16 MED ORDER — ONDANSETRON HCL 4 MG/2ML IJ SOLN
4.0000 mg | Freq: Once | INTRAMUSCULAR | Status: AC
  Administered 2021-09-16: 4 mg via INTRAVENOUS
  Filled 2021-09-16: qty 2

## 2021-09-16 MED ORDER — IOHEXOL 300 MG/ML  SOLN
100.0000 mL | Freq: Once | INTRAMUSCULAR | Status: AC | PRN
  Administered 2021-09-16: 100 mL via INTRAVENOUS

## 2021-09-16 MED ORDER — HYDROCODONE-ACETAMINOPHEN 5-325 MG PO TABS: 1.0000 | ORAL_TABLET | Freq: Four times a day (QID) | ORAL | 0 refills | Status: AC | PRN

## 2021-09-16 MED ORDER — IBUPROFEN 600 MG PO TABS: 600.0000 mg | ORAL_TABLET | Freq: Four times a day (QID) | ORAL | 0 refills | Status: AC | PRN

## 2021-09-16 MED ORDER — MORPHINE SULFATE (PF) 4 MG/ML IV SOLN
4.0000 mg | Freq: Once | INTRAVENOUS | Status: AC
  Administered 2021-09-16: 4 mg via INTRAVENOUS
  Filled 2021-09-16: qty 1

## 2021-09-16 MED ORDER — CEFAZOLIN SODIUM-DEXTROSE 1-4 GM/50ML-% IV SOLN
1.0000 g | Freq: Three times a day (TID) | INTRAVENOUS | Status: DC
  Administered 2021-09-16: 1 g via INTRAVENOUS
  Filled 2021-09-16: qty 50

## 2021-09-16 NOTE — ED Notes (Signed)
Patient transported to CT 

## 2021-09-16 NOTE — ED Triage Notes (Signed)
Pt states that she has abscess on stomach, was drained at [facility] on Tuesday, placed on doxycycline, has been taking but threw it up today. Now abscess is larger with lots of drainage, with fevers at home.  ?

## 2021-09-16 NOTE — ED Provider Notes (Signed)
?[facility] EMERGENCY DEPARTMENT ?Provider Note ? ? ?CSN: [id number] ?Arrival date & time: 09/16/21  1857 ? ?  ? ?History ? ?Chief Complaint  ?Patient presents with  ? Abscess  ? ? ?Virginia Davis is a 36 y.o. female. ? ?Pt is a 36 yo female presenting for abscess. Pt first noticed pain and swelling three days ago located at right lower abdominal wall. Admits to wound with purulent drainage today. Denies fevers, chills, nausea, or vomiting. Denies prior hx of abscess. Denies abdominal injections.  ? ?The history is provided by the patient. No language interpreter was used.  ?Abscess ?Associated symptoms: no fever and no vomiting   ? ?  ? ?Home Medications ?Prior to Admission medications   ?Medication Sig Start Date End Date Taking? Authorizing Provider  ?albuterol (PROVENTIL HFA;VENTOLIN HFA) 108 (90 Base) MCG/ACT inhaler Inhale 1 puff into the lungs every 6 (six) hours as needed for wheezing or shortness of breath.    [provider]  ?diphenhydrAMINE (BENADRYL) 25 MG tablet Take 1 tablet (25 mg total) by mouth every 6 (six) hours for 5 days. ?Patient not taking: Reported on 09/15/2021 04/18/19 04/23/19  Nira Conn, MD  ?doxycycline (VIBRAMYCIN) 100 MG capsule Take 1 capsule (100 mg total) by mouth 2 (two) times daily for 7 days. 09/15/21 09/22/21  Valentino Nose, NP  ?EPINEPHrine 0.3 mg/0.3 mL IJ SOAJ injection Inject 0.3 mLs (0.3 mg total) into the muscle as needed for anaphylaxis. 04/18/19   Nira Conn, MD  ?hydrOXYzine (ATARAX/VISTARIL) 25 MG tablet Take 1 tablet (25 mg total) by mouth every 6 (six) hours. ?Patient not taking: Reported on 09/15/2021 08/25/19   Hall-Potvin, [place], PA-C  ?ondansetron (ZOFRAN ODT) 4 MG disintegrating tablet Take 1 tablet (4 mg total) by mouth every 8 (eight) hours as needed for nausea or vomiting. ?Patient not taking: Reported on 09/15/2021 12/12/20   Cristina Gong, PA-C  ?   ? ?Allergies    ?Food, Shellfish allergy, and  Penicillins   ? ?Review of Systems   ?Review of Systems  ?Constitutional:  Negative for chills and fever.  ?HENT:  Negative for ear pain and sore throat.   ?Eyes:  Negative for pain and visual disturbance.  ?Respiratory:  Negative for cough and shortness of breath.   ?Cardiovascular:  Negative for chest pain and palpitations.  ?Gastrointestinal:  Positive for abdominal pain. Negative for vomiting.  ?Genitourinary:  Negative for dysuria and hematuria.  ?Musculoskeletal:  Negative for arthralgias and back pain.  ?Skin:  Positive for wound. Negative for color change and rash.  ?Neurological:  Negative for seizures and syncope.  ?All other systems reviewed and are negative. ? ?Physical Exam ?Updated Vital Signs ?BP 128/78 (BP Location: Right Arm)   Pulse 100   Temp 99.1 ?F (37.3 ?C) (Oral)   Resp 14   Ht 5\' 5"  (1.651 m)   Wt 111.1 kg   LMP 08/31/2021 (Approximate)   SpO2 96%   BMI 40.77 kg/m?  ?Physical Exam ?Vitals and nursing note reviewed.  ?Constitutional:   ?   General: She is not in acute distress. ?   Appearance: She is well-developed.  ?HENT:  ?   Head: Normocephalic and atraumatic.  ?Eyes:  ?   Conjunctiva/sclera: Conjunctivae normal.  ?Cardiovascular:  ?   Rate and Rhythm: Normal rate and regular rhythm.  ?   Heart sounds: No murmur heard. ?Pulmonary:  ?   Effort: Pulmonary effort is normal. No respiratory distress.  ?  Breath sounds: Normal breath sounds.  ?Abdominal:  ?   Palpations: Abdomen is soft.  ?   Tenderness: There is no abdominal tenderness.  ? ? ?Musculoskeletal:     ?   General: No swelling.  ?   Cervical back: Neck supple.  ?Skin: ?   General: Skin is warm and dry.  ?   Capillary Refill: Capillary refill takes less than 2 seconds.  ?Neurological:  ?   Mental Status: She is alert.  ?Psychiatric:     ?   Mood and Affect: Mood normal.  ? ? ?ED Results / Procedures / Treatments   ?Labs ?(all labs ordered are listed, but only abnormal results are displayed) ?Labs Reviewed  ?CBC WITH  DIFFERENTIAL/PLATELET - Abnormal; Notable for the following components:  ?    Result Value  ? Hemoglobin 9.3 (*)   ? HCT 31.1 (*)   ? MCV 78.9 (*)   ? MCH 23.6 (*)   ? MCHC 29.9 (*)   ? RDW 15.9 (*)   ? All other components within normal limits  ?BASIC METABOLIC PANEL - Abnormal; Notable for the following components:  ? Potassium 2.8 (*)   ? BUN <5 (*)   ? Calcium 8.6 (*)   ? All other components within normal limits  ?CULTURE, BLOOD (ROUTINE X 2)  ?CULTURE, BLOOD (ROUTINE X 2)  ?LACTIC ACID, PLASMA  ?LACTIC ACID, PLASMA  ?I-STAT BETA HCG BLOOD, ED ([facility], [facility], [facility] ONLY)  ? ? ?EKG ?None ? ?Radiology ?No results found. ? ?Procedures ?Procedures  ? ? ?Medications Ordered in ED ?Medications - No data to display ? ?ED Course/ Medical Decision Making/ A&P ?  ?                        ?Medical Decision Making ?Amount and/or Complexity of Data Reviewed ?Radiology: ordered. ? ?Risk ?Prescription drug management. ? ? ?8:35 PM ?36 yo female presenting for abdominal wall wound. Pt is Aox3, no acute distress, afebrile, with stable vitals. Physical exam demonstrates 1 cm wound with purulent drainage. Approx 7 cm of induration and erythema surrounding wound. No sepsis at this time. Clindamycin given to patient along with mupirocin cream. Patient recommended for prompt return to ED if fever, nausea, vomiting, malaise, or worsening infection develops. Wound already draining on exam-no need for I&D.  Stable CT abdomen demonstrating soft tissue infection only.  ? ?Patient in no distress and overall condition improved here in the ED. Detailed discussions were had with the patient regarding current findings, and need for close f/u with PCP or on call doctor. The patient has been instructed to return immediately if the symptoms worsen in any way for re-evaluation. Patient verbalized understanding and is in agreement with current care plan. All questions answered prior to discharge. ? ? ? ? ? ? ? ? ?Final Clinical Impression(s) / ED  Diagnoses ?Final diagnoses:  ?Abdominal wall abscess  ?Cellulitis, unspecified cellulitis site  ? ? ?Rx / DC Orders ?ED Discharge Orders   ? ? None  ? ?  ? ? ?  ?Franne Forts, DO ?09/18/21 1050 ? ?

## 2021-09-16 NOTE — ED Provider Triage Note (Signed)
Emergency Medicine Provider Triage Evaluation Note ? ?Virginia Davis , a 36 y.o. female  was evaluated in triage.  Pt complains of right lower abdomen abscess with no improvement despite drainage, and being placed on doxycycline.  Patient reports some fevers at home, as well as some nausea taking her doxycycline.  Patient has otherwise been compliant.  She reports history of gastric sleeve 4 years ago, denies any other significant medical history.  Allergy to penicillin. ? ?Review of Systems  ?Positive: Abscess, fever, nausea ?Negative: Abdominal pain ? ?Physical Exam  ?BP 128/78 (BP Location: Right Arm)   Pulse 100   Temp 99.1 ?F (37.3 ?C) (Oral)   Resp 14   Ht 5\' 5"  (1.651 m)   Wt 111.1 kg   LMP 08/31/2021 (Approximate)   SpO2 96%   BMI 40.77 kg/m?  ?Gen:   Awake, no distress   ?Resp:  Normal effort  ?MSK:   Moves extremities without difficulty ?Other:  Red swollen abscess with active drainage right lower abdomen ? ?Medical Decision Making  ?Medically screening exam initiated at 7:44 PM.  Appropriate orders placed.  Komalpreet Blouin was informed that the remainder of the evaluation will be completed by another provider, this initial triage assessment does not replace that evaluation, and the importance of remaining in the ED until their evaluation is complete. ? ?Workup initiated ?  ?Anselmo Pickler, PA-C ?09/16/21 1945 ? ?

## 2021-09-17 ENCOUNTER — Telehealth: Payer: Self-pay

## 2021-09-17 NOTE — Telephone Encounter (Signed)
RNCM spoke with [facility] to provide verbal to change mupirocin cream (bactroban) 2% to mupirocin ointment 22 gram. Medicaid requires prior auth for cream verses ointment. ? ?No additional TOC needs. ?

## 2021-09-21 LAB — CULTURE, BLOOD (ROUTINE X 2)
Culture: NO GROWTH
Culture: NO GROWTH
Special Requests: ADEQUATE

## 2021-09-23 ENCOUNTER — Other Ambulatory Visit: Payer: Self-pay

## 2021-09-23 ENCOUNTER — Encounter: Payer: Self-pay | Admitting: Family Medicine

## 2021-09-23 ENCOUNTER — Ambulatory Visit (INDEPENDENT_AMBULATORY_CARE_PROVIDER_SITE_OTHER): Payer: Medicaid Other | Admitting: Nurse Practitioner

## 2021-09-23 ENCOUNTER — Encounter: Payer: Self-pay | Admitting: Nurse Practitioner

## 2021-09-23 VITALS — BP 118/78 | HR 63 | Temp 98.6°F | Resp 16 | Ht 64.5 in | Wt 259.0 lb

## 2021-09-23 DIAGNOSIS — L02211 Cutaneous abscess of abdominal wall: Secondary | ICD-10-CM | POA: Diagnosis not present

## 2021-09-23 NOTE — Progress Notes (Signed)
Hosptial follow up ?Abscess  ?

## 2021-09-23 NOTE — Patient Instructions (Addendum)
1. Abscess of skin of abdomen ? ?- Ambulatory referral to General Surgery ? ?Continue Clindamycin ? ?Stay well hydrated ? ?Follow up: ? ?Follow up as needed ? ? ? ?Skin Abscess ?A skin abscess is an infected area on or under your skin that contains a collection of pus and other material. An abscess may also be called a furuncle, carbuncle, or boil. An abscess can occur in or on almost any part of your body. ?Some abscesses break open (rupture) on their own. Most continue to get worse unless they are treated. The infection can spread deeper into the body and eventually into your blood, which can make you feel ill. Treatment usually involves draining the abscess. ?What are the causes? ?An abscess occurs when germs, like bacteria, pass through your skin and cause an infection. This may be caused by: ?A scrape or cut on your skin. ?A puncture wound through your skin, including a needle injection or insect bite. ?Blocked oil or sweat glands. ?Blocked and infected hair follicles. ?A cyst that forms beneath your skin (sebaceous cyst) and becomes infected. ?What increases the risk? ?This condition is more likely to develop in people who: ?Have a weak body defense system (immune system). ?Have diabetes. ?Have dry and irritated skin. ?Get frequent injections or use illegal IV drugs. ?Have a foreign body in a wound, such as a splinter. ?Have problems with their lymph system or veins. ?What are the signs or symptoms? ?Symptoms of this condition include: ?A painful, firm bump under the skin. ?A bump with pus at the top. This may break through the skin and drain. ?Other symptoms include: ?Redness surrounding the abscess site. ?Warmth. ?Swelling of the lymph nodes (glands) near the abscess. ?Tenderness. ?A sore on the skin. ?How is this diagnosed? ?This condition may be diagnosed based on: ?A physical exam. ?Your medical history. ?A sample of pus. This may be used to find out what is causing the infection. ?Blood tests. ?Imaging  tests, such as an ultrasound, CT scan, or MRI. ?How is this treated? ?A small abscess that drains on its own may not need treatment. Treatment for larger abscesses may include: ?Moist heat or heat pack applied to the area several times a day. ?A procedure to drain the abscess (incision and drainage). ?Antibiotic medicines. For a severe abscess, you may first get antibiotics through an IV and then change to antibiotics by mouth. ?Follow these instructions at home: ?Medicines ? ?Take over-the-counter and prescription medicines only as told by your health care provider. ?If you were prescribed an antibiotic medicine, take it as told by your health care provider. Do not stop taking the antibiotic even if you start to feel better. ?Abscess care ? ?If you have an abscess that has not drained, apply heat to the affected area. Use the heat source that your health care provider recommends, such as a moist heat pack or a heating pad. ?Place a towel between your skin and the heat source. ?Leave the heat on for 20-30 minutes. ?Remove the heat if your skin turns bright red. This is especially important if you are unable to feel pain, heat, or cold. You may have a greater risk of getting burned. ?Follow instructions from your health care provider about how to take care of your abscess. Make sure you: ?Cover the abscess with a bandage (dressing). ?Change your dressing or gauze as told by your health care provider. ?Wash your hands with soap and water before you change the dressing or gauze. If soap and  water are not available, use hand sanitizer. ?Check your abscess every day for signs of a worsening infection. Check for: ?More redness, swelling, or pain. ?More fluid or blood. ?Warmth. ?More pus or a bad smell. ?General instructions ?To avoid spreading the infection: ?Do not share personal care items, towels, or hot tubs with others. ?Avoid making skin contact with other people. ?Keep all follow-up visits as told by your health  care provider. This is important. ?Contact a health care provider if you have: ?More redness, swelling, or pain around your abscess. ?More fluid or blood coming from your abscess. ?Warm skin around your abscess. ?More pus or a bad smell coming from your abscess. ?A fever. ?Muscle aches. ?Chills or a general ill feeling. ?Get help right away if you: ?Have severe pain. ?See red streaks on your skin spreading away from the abscess. ?Summary ?A skin abscess is an infected area on or under your skin that contains a collection of pus and other material. ?A small abscess that drains on its own may not need treatment. ?Treatment for larger abscesses may include having a procedure to drain the abscess and taking an antibiotic. ?This information is not intended to replace advice given to you by your health care provider. Make sure you discuss any questions you have with your health care provider. ?Document Revised: 10/19/2018 Document Reviewed: 08/11/2017 ?Elsevier Patient Education ? 2022 [employer] ? ?

## 2021-09-23 NOTE — Progress Notes (Signed)
? ?@Patient  ID: Virginia LekAntoinette Geiman, female    DOB: [birth year], 36 y.o.   MRN: [id number] ? ?Chief Complaint  ?Patient presents with  ? Hospitalization Follow-up  ? Abscess  ? ? ?Referring provider: ?No ref. provider found ? ? ?HPI ? ?Patient presents today abdominal skin abscess.  Patient states that she was seen for this issue in the ED and was prescribed doxycycline and clindamycin.  She has she is currently still on clindamycin for this issue.  She did also have I&D.  She states that the area is no longer draining.  Upon exam there is still a large hard tender area noted.  We discussed that the patient will need a referral to general surgery for further evaluation and for another possible I&D. Denies f/c/s, n/v/d, hemoptysis, PND, chest pain or edema. ? ? ? ?Allergies  ?Allergen Reactions  ? Food Anaphylaxis  ?  pistacho  ? Shellfish Allergy Anaphylaxis  ? Penicillins   ?  Has patient had a PCN reaction causing immediate rash, facial/tongue/throat swelling, SOB or lightheadedness with hypotension: Yes ?Has patient had a PCN reaction causing severe rash involving mucus membranes or skin necrosis: No ?Has patient had a PCN reaction that required hospitalization No ?Has patient had a PCN reaction occurring within the last 10 years: No ?If all of the above answers are "NO", then may proceed with Cephalosporin use. ? ? ?  ? ? ? ?There is no immunization history on file for this patient. ? ?Past Medical History:  ?Diagnosis Date  ? Asthma   ? Diabetes mellitus without complication (HCC)   ? not taking metformin anymore, hasn't seen MD for this since 2014  ? Hypertension   ? ? ?Tobacco History: ?Social History  ? ?Tobacco Use  ?Smoking Status Never  ?Smokeless Tobacco Never  ? ?Counseling given: Not Answered ? ? ?Outpatient Encounter Medications as of 09/23/2021  ?Medication Sig  ? albuterol (PROVENTIL HFA;VENTOLIN HFA) 108 (90 Base) MCG/ACT inhaler Inhale 1 puff into the lungs every 6 (six) hours as needed for wheezing  or shortness of breath.  ? [EXPIRED] clindamycin (CLEOCIN) 300 MG capsule Take 1 capsule (300 mg total) by mouth every 6 (six) hours for 7 days.  ? diphenhydrAMINE (BENADRYL) 25 MG tablet Take 1 tablet (25 mg total) by mouth every 6 (six) hours for 5 days. (Patient not taking: Reported on 09/15/2021)  ? EPINEPHrine 0.3 mg/0.3 mL IJ SOAJ injection Inject 0.3 mLs (0.3 mg total) into the muscle as needed for anaphylaxis.  ? HYDROcodone-acetaminophen (NORCO) 5-325 MG tablet Take 1 tablet by mouth every 6 (six) hours as needed for severe pain.  ? hydrOXYzine (ATARAX/VISTARIL) 25 MG tablet Take 1 tablet (25 mg total) by mouth every 6 (six) hours. (Patient not taking: Reported on 09/15/2021)  ? ibuprofen (ADVIL) 600 MG tablet Take 1 tablet (600 mg total) by mouth every 6 (six) hours as needed for mild pain.  ? mupirocin cream (BACTROBAN) 2 % Apply 1 application. topically 2 (two) times daily.  ? ondansetron (ZOFRAN ODT) 4 MG disintegrating tablet Take 1 tablet (4 mg total) by mouth every 8 (eight) hours as needed for nausea or vomiting. (Patient not taking: Reported on 09/15/2021)  ? ?No facility-administered encounter medications on file as of 09/23/2021.  ? ? ? ?Review of Systems ? ?Review of Systems  ?Constitutional: Negative.   ?HENT: Negative.    ?Cardiovascular: Negative.   ?Gastrointestinal: Negative.   ?Skin:   ?     Skin abscess to abdomen  ?Allergic/Immunologic:  Negative.   ?Neurological: Negative.   ?Psychiatric/Behavioral: Negative.     ? ? ? ?Physical Exam ? ?BP 118/78 (BP Location: Right Arm, Patient Position: Sitting, Cuff Size: Large)   Pulse 63   Temp 98.6 ?F (37 ?C) (Oral)   Resp 16   Ht 5' 4.5" (1.638 m)   Wt 259 lb (117.5 kg)   LMP 09/04/2021 (Approximate)   SpO2 98%   BMI 43.77 kg/m?  ? ?Wt Readings from Last 5 Encounters:  ?09/23/21 259 lb (117.5 kg)  ?09/16/21 245 lb (111.1 kg)  ?12/12/20 252 lb (114.3 kg)  ?04/18/19 219 lb (99.3 kg)  ?06/16/17 (!) 330 lb (149.7 kg)  ? ? ? ?Physical Exam ?Vitals and  nursing note reviewed.  ?Constitutional:   ?   General: She is not in acute distress. ?   Appearance: She is well-developed.  ?Cardiovascular:  ?   Rate and Rhythm: Normal rate and regular rhythm.  ?Pulmonary:  ?   Effort: Pulmonary effort is normal.  ?   Breath sounds: Normal breath sounds.  ?Skin: ?   Findings: Abscess present.  ? ?    ?Neurological:  ?   Mental Status: She is alert and oriented to person, place, and time.  ? ? ? ?Lab Results: ? ?CBC ?   ?Component Value Date/Time  ? WBC 6.6 09/16/2021 1942  ? RBC 3.94 09/16/2021 1942  ? HGB 9.3 (L) 09/16/2021 1942  ? HCT 31.1 (L) 09/16/2021 1942  ? PLT 269 09/16/2021 1942  ? MCV 78.9 (L) 09/16/2021 1942  ? MCH 23.6 (L) 09/16/2021 1942  ? MCHC 29.9 (L) 09/16/2021 1942  ? RDW 15.9 (H) 09/16/2021 1942  ? LYMPHSABS 2.4 09/16/2021 1942  ? MONOABS 0.1 09/16/2021 1942  ? EOSABS 0.0 09/16/2021 1942  ? BASOSABS 0.1 09/16/2021 1942  ? ? ?BMET ?   ?Component Value Date/Time  ? NA 138 09/16/2021 1942  ? K 2.8 (L) 09/16/2021 1942  ? CL 102 09/16/2021 1942  ? CO2 25 09/16/2021 1942  ? GLUCOSE 80 09/16/2021 1942  ? BUN <5 (L) 09/16/2021 1942  ? CREATININE 0.67 09/16/2021 1942  ? CALCIUM 8.6 (L) 09/16/2021 1942  ? GFRNONAA >60 09/16/2021 1942  ? GFRAA >60 03/03/2019 1420  ? ? ?BNP ?No results found for: BNP ? ?ProBNP ?No results found for: PROBNP ? ?Imaging: ?CT ABDOMEN PELVIS W CONTRAST ? ?Result Date: 09/16/2021 ?CLINICAL DATA:  right abdominal abscess with induration over 7 cm Patient states this abscess was drained yesterday, and is increasing in size. EXAM: CT ABDOMEN AND PELVIS WITH CONTRAST TECHNIQUE: Multidetector CT imaging of the abdomen and pelvis was performed using the standard protocol following bolus administration of intravenous contrast. RADIATION DOSE REDUCTION: This exam was performed according to the departmental dose-optimization program which includes automated exposure control, adjustment of the mA and/or kV according to patient size and/or use of iterative  reconstruction technique. CONTRAST:  OMNIPAQUE IOHEXOL 300 MG/ML  SOLN COMPARISON:  CT 01/11/2021 FINDINGS: Lower chest: Clear lung bases. Hepatobiliary: No focal liver abnormality is seen. No gallstones, gallbladder wall thickening, or biliary dilatation. Pancreas: No ductal dilatation or inflammation. Spleen: Normal in size without focal abnormality. Adrenals/Urinary Tract: Normal adrenal glands. No hydronephrosis or perinephric edema. Homogeneous renal enhancement no renal calculi or focal renal lesion. Urinary bladder is physiologically distended without wall thickening. 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 Normal small bowel, appendix, and colon. No bowel inflammation or obstruction. Vascular/Lymphatic: Few prominent right inguinal nodes are typically reactive. Normal appearing vascular structures.  Normal caliber abdominal aorta. Patent portal vein. Reproductive: Uterus and bilateral adnexa are unremarkable. Other: There is prominent skin thickening involving the right lower anterior abdominal wall. The region of inflammation spans approximately 21 x 10 cm dimension. Skin thickening is 8-9 mm. There is a small central fluid density, series 3, image 64, that is subcentimeter. No other focal fluid collection. Fat stranding is seen of the subjacent subcutaneous tissues. There is no soft tissue air. No intra-abdominopelvic fluid collection. No ascites or free air. Musculoskeletal: There are no acute or suspicious osseous abnormalities. IMPRESSION: 1. Prominent skin thickening involving the right lower anterior abdominal wall with subjacent subcutaneous fat stranding. Findings are consistent with cellulitis. Small associated subcentimeter abscess. No soft tissue air. 2. No acute intra-abdominal/pelvic findings. Electronically Signed   By: Narda Rutherford M.D.   On: 09/16/2021 21:16   ? ? ?Assessment & Plan:  ? ?Abscess of skin of abdomen ?- Ambulatory referral to General Surgery ? ?Continue  Clindamycin ? ?Stay well hydrated ? ?Follow up: ? ?Follow up as needed ? ? ? ? ?Ivonne Andrew, NP ?09/29/2021 ? ? ?

## 2021-09-29 ENCOUNTER — Encounter: Payer: Self-pay | Admitting: Nurse Practitioner

## 2021-09-29 DIAGNOSIS — L02211 Cutaneous abscess of abdominal wall: Secondary | ICD-10-CM | POA: Insufficient documentation

## 2021-09-29 NOTE — Assessment & Plan Note (Signed)
-   Ambulatory referral to General Surgery ? ?Continue Clindamycin ? ?Stay well hydrated ? ?Follow up: ? ?Follow up as needed ?

## 2022-12-15 ENCOUNTER — Ambulatory Visit (INDEPENDENT_AMBULATORY_CARE_PROVIDER_SITE_OTHER): Payer: Medicaid Other | Admitting: Obstetrics and Gynecology

## 2022-12-15 ENCOUNTER — Encounter: Payer: Self-pay | Admitting: Obstetrics and Gynecology

## 2022-12-15 ENCOUNTER — Other Ambulatory Visit (HOSPITAL_COMMUNITY)
Admission: RE | Admit: 2022-12-15 | Discharge: 2022-12-15 | Disposition: A | Discharge status: Home / Self Care | Payer: Medicaid Other | Source: Ambulatory Visit | Attending: Obstetrics and Gynecology | Admitting: Obstetrics and Gynecology

## 2022-12-15 VITALS — BP 120/81 | HR 84 | Ht 64.0 in | Wt 235.6 lb

## 2022-12-15 DIAGNOSIS — N920 Excessive and frequent menstruation with regular cycle: Secondary | ICD-10-CM

## 2022-12-15 DIAGNOSIS — Z01419 Encounter for gynecological examination (general) (routine) without abnormal findings: Secondary | ICD-10-CM | POA: Insufficient documentation

## 2022-12-15 NOTE — Progress Notes (Signed)
Patient presents for AEX and to discuss dysmenorrhea.  Last Pap: Reports 05/13/2016 Contraception: Declines, same sex partner Vaginal/Urinary Symptoms: Denies STD Screen: Desires vaginal swab, blood work

## 2022-12-15 NOTE — Progress Notes (Signed)
Subjective:     Virginia Davis is a 37 y.o. female with LMP 12/07/22 and 40 who is here for a comprehensive physical exam. The patient reports a monthly period lasting 5-7 days, heavy in flow for 2 days. She reports a history of oligomenorrhea related to PCOS. Her cycle has become regular since her weight loss surgery. She experiences dysmenorrhea during the 2 days of heavy flow. She is sexually active in a same sex relationship. She denies pelvic pain or abnormal discharge. She desires STI testing. She is contemplating a pregnancy in the future. She is without any other complaints  Past Medical History:  Diagnosis Date   Asthma    Diabetes mellitus without complication (HCC)    not taking metformin anymore, hasn't seen MD for this since 2014   Hypertension    Past Surgical History:  Procedure Laterality Date   NO PAST SURGERIES     SLEEVE GASTROPLASTY     Family History  Problem Relation Age of Onset   Gestational diabetes Mother    Hypertension Maternal Grandmother     Social History   Socioeconomic History   Marital status: Single    Spouse name: Not on file   Number of children: Not on file   Years of education: Not on file   Highest education level: Not on file  Occupational History   Not on file  Tobacco Use   Smoking status: Never   Smokeless tobacco: Never  Vaping Use   Vaping Use: Never used  Substance and Sexual Activity   Alcohol use: No   Drug use: No   Sexual activity: Never    Birth control/protection: None  Other Topics Concern   Not on file  Social History Narrative   Not on file   Social Determinants of Health   Financial Resource Strain: Not on file  Food Insecurity: Not on file  Transportation Needs: Not on file  Physical Activity: Not on file  Stress: Not on file  Social Connections: Not on file  Intimate Partner Violence: Not on file   Health Maintenance  Topic Date Due   HEMOGLOBIN A1C  Never done   COVID-19 Vaccine (1) Never done    FOOT EXAM  Never done   OPHTHALMOLOGY EXAM  Never done   Diabetic kidney evaluation - Urine ACR  Never done   Hepatitis C Screening  Never done   DTaP/Tdap/Td (1 - Tdap) Never done   PAP SMEAR-Modifier  05/14/2019   Diabetic kidney evaluation - eGFR measurement  09/17/2022   INFLUENZA VACCINE  02/10/2023   HIV Screening  Completed   HPV VACCINES  Aged Out       Review of Systems Pertinent items noted in HPI and remainder of comprehensive ROS otherwise negative.   Objective:  Blood pressure 120/81, pulse 84, height 5\' 4"  (1.626 m), weight 235 lb 9.6 oz (106.9 kg), last menstrual period 12/07/2022.   GENERAL: Well-developed, well-nourished female in no acute distress.  HEENT: Normocephalic, atraumatic. Sclerae anicteric.  NECK: Supple. Normal thyroid.  LUNGS: Clear to auscultation bilaterally.  HEART: Regular rate and rhythm. BREASTS: Symmetric in size. No palpable masses or lymphadenopathy, skin changes, or nipple drainage. ABDOMEN: Soft, nontender, nondistended. No organomegaly. PELVIC: Normal external female genitalia. Vagina is pink and rugated.  Normal discharge. Normal appearing cervix. Uterus is normal in size. No adnexal mass or tenderness. Chaperone present during the pelvic exam EXTREMITIES: No cyanosis, clubbing, or edema, 2+ distal pulses.     Assessment:  Healthy female exam.      Plan:    Pap smear collected Pelvic ultrasound ordered to rule out presence of a fibroid or polyp STI screening ordered Patient will be contacted with abnormal results Discussed medical management with contraception or lysteda.  Patient with abnormal thyroid studies with plans to follow up with PCP later this week.  Plan to return to discuss cycle control, if correction of thyroid function does not improve her cycle See After Visit Summary for Counseling Recommendations

## 2022-12-16 LAB — RPR: RPR Ser Ql: NONREACTIVE

## 2022-12-16 LAB — CERVICOVAGINAL ANCILLARY ONLY
Bacterial Vaginitis (gardnerella): NEGATIVE
Candida Glabrata: NEGATIVE
Candida Vaginitis: NEGATIVE
Chlamydia: NEGATIVE
Comment: NEGATIVE
Comment: NEGATIVE
Comment: NEGATIVE
Comment: NEGATIVE
Comment: NEGATIVE
Comment: NORMAL
Neisseria Gonorrhea: NEGATIVE
Trichomonas: NEGATIVE

## 2022-12-16 LAB — HEPATITIS B SURFACE ANTIGEN: Hepatitis B Surface Ag: NEGATIVE

## 2022-12-16 LAB — HIV ANTIBODY (ROUTINE TESTING W REFLEX): HIV Screen 4th Generation wRfx: NONREACTIVE

## 2022-12-16 LAB — HEPATITIS C ANTIBODY: Hep C Virus Ab: NONREACTIVE

## 2022-12-20 LAB — CYTOLOGY - PAP
Comment: NEGATIVE
Diagnosis: NEGATIVE
High risk HPV: NEGATIVE

## 2022-12-22 ENCOUNTER — Ambulatory Visit (HOSPITAL_BASED_OUTPATIENT_CLINIC_OR_DEPARTMENT_OTHER)
Admission: RE | Admit: 2022-12-22 | Discharge: 2022-12-22 | Disposition: A | Discharge status: Home / Self Care | Payer: Medicaid Other | Source: Ambulatory Visit | Attending: Obstetrics and Gynecology | Admitting: Obstetrics and Gynecology

## 2022-12-22 DIAGNOSIS — N920 Excessive and frequent menstruation with regular cycle: Secondary | ICD-10-CM | POA: Insufficient documentation

## 2023-05-31 ENCOUNTER — Ambulatory Visit: Payer: Medicaid Other | Admitting: Sports Medicine

## 2024-01-27 ENCOUNTER — Emergency Department (HOSPITAL_COMMUNITY)

## 2024-01-27 ENCOUNTER — Emergency Department (HOSPITAL_COMMUNITY)
Admission: EM | Admit: 2024-01-27 | Discharge: 2024-01-27 | Disposition: A | Discharge status: Home / Self Care | Attending: Emergency Medicine | Admitting: Emergency Medicine

## 2024-01-27 ENCOUNTER — Other Ambulatory Visit: Payer: Self-pay

## 2024-01-27 DIAGNOSIS — K529 Noninfective gastroenteritis and colitis, unspecified: Secondary | ICD-10-CM | POA: Diagnosis not present

## 2024-01-27 DIAGNOSIS — J45909 Unspecified asthma, uncomplicated: Secondary | ICD-10-CM | POA: Insufficient documentation

## 2024-01-27 DIAGNOSIS — E119 Type 2 diabetes mellitus without complications: Secondary | ICD-10-CM | POA: Diagnosis not present

## 2024-01-27 DIAGNOSIS — I10 Essential (primary) hypertension: Secondary | ICD-10-CM | POA: Diagnosis not present

## 2024-01-27 DIAGNOSIS — K567 Ileus, unspecified: Secondary | ICD-10-CM | POA: Diagnosis not present

## 2024-01-27 DIAGNOSIS — R112 Nausea with vomiting, unspecified: Secondary | ICD-10-CM | POA: Diagnosis present

## 2024-01-27 LAB — URINALYSIS, ROUTINE W REFLEX MICROSCOPIC
Bilirubin Urine: NEGATIVE
Glucose, UA: NEGATIVE mg/dL
Hgb urine dipstick: NEGATIVE
Ketones, ur: 80 mg/dL — AB
Leukocytes,Ua: NEGATIVE
Nitrite: NEGATIVE
Protein, ur: NEGATIVE mg/dL
Specific Gravity, Urine: >1.046 — ABNORMAL HIGH (ref 1.005–1.030)
pH: 5 (ref 5.0–8.0)

## 2024-01-27 LAB — COMPREHENSIVE METABOLIC PANEL WITH GFR
ALT: 9 U/L (ref 0–44)
AST: 16 U/L (ref 15–41)
Albumin: 4.1 g/dL (ref 3.5–5.0)
Alkaline Phosphatase: 64 U/L (ref 38–126)
Anion gap: 12 (ref 5–15)
BUN: 9 mg/dL (ref 6–20)
CO2: 19 mmol/L — ABNORMAL LOW (ref 22–32)
Calcium: 9.3 mg/dL (ref 8.9–10.3)
Chloride: 106 mmol/L (ref 98–111)
Creatinine, Ser: 0.35 mg/dL — ABNORMAL LOW (ref 0.44–1.00)
GFR, Estimated: >60 mL/min (ref >60)
Glucose, Bld: 90 mg/dL (ref 70–99)
Potassium: 3.5 mmol/L (ref 3.5–5.1)
Sodium: 137 mmol/L (ref 135–145)
Total Bilirubin: 1 mg/dL (ref 0.0–1.2)
Total Protein: 7.8 g/dL (ref 6.5–8.1)

## 2024-01-27 LAB — LIPASE, BLOOD: Lipase: 33 U/L (ref 11–51)

## 2024-01-27 LAB — CBC
HCT: 40.1 % (ref 36.0–46.0)
Hemoglobin: 13.6 g/dL (ref 12.0–15.0)
MCH: 32.1 pg (ref 26.0–34.0)
MCHC: 33.9 g/dL (ref 30.0–36.0)
MCV: 94.6 fL (ref 80.0–100.0)
Platelets: 231 K/uL (ref 150–400)
RBC: 4.24 MIL/uL (ref 3.87–5.11)
RDW: 12.9 % (ref 11.5–15.5)
WBC: 5.3 K/uL (ref 4.0–10.5)
nRBC: 0 % (ref 0.0–0.2)

## 2024-01-27 LAB — HCG, SERUM, QUALITATIVE: Preg, Serum: NEGATIVE

## 2024-01-27 MED ORDER — HYOSCYAMINE SULFATE 0.125 MG SL SUBL: 0.1250 mg | SUBLINGUAL_TABLET | Freq: Four times a day (QID) | SUBLINGUAL | 0 refills | Status: AC | PRN

## 2024-01-27 MED ORDER — KETOROLAC TROMETHAMINE 30 MG/ML IJ SOLN
30.0000 mg | Freq: Once | INTRAMUSCULAR | Status: AC
  Administered 2024-01-27: 30 mg via INTRAVENOUS
  Filled 2024-01-27: qty 1

## 2024-01-27 MED ORDER — IOHEXOL 300 MG/ML  SOLN
100.0000 mL | Freq: Once | INTRAMUSCULAR | Status: AC | PRN
  Administered 2024-01-27: 100 mL via INTRAVENOUS

## 2024-01-27 MED ORDER — ACETAMINOPHEN 10 MG/ML IV SOLN
1000.0000 mg | INTRAVENOUS | Status: AC
Start: 2024-01-27 — End: 2024-01-27
  Administered 2024-01-27: 1000 mg via INTRAVENOUS
  Filled 2024-01-27: qty 100

## 2024-01-27 MED ORDER — ONDANSETRON 8 MG PO TBDP: ORAL_TABLET | ORAL | 0 refills | Status: AC

## 2024-01-27 MED ORDER — ONDANSETRON HCL 4 MG/2ML IJ SOLN
4.0000 mg | Freq: Once | INTRAMUSCULAR | Status: AC
  Administered 2024-01-27: 4 mg via INTRAVENOUS
  Filled 2024-01-27: qty 2

## 2024-01-27 NOTE — ED Provider Notes (Signed)
 [place] EMERGENCY DEPARTMENT AT [facility] Provider Note   CSN: [id number] Arrival date & time: 01/27/24  9749     Patient presents with: Abdominal Pain   Virginia Davis is a 38 y.o. female.   The history is provided by the patient.  Abdominal Pain Pain location:  Suprapubic Pain radiates to:  Does not radiate Pain severity:  Moderate Onset quality:  Sudden Timing:  Constant Progression:  Unchanged Chronicity:  New Context: not diet changes, not eating, not laxative use and not trauma   Relieved by:  Nothing Worsened by:  Nothing Associated symptoms: diarrhea, nausea and vomiting   Associated symptoms: no fever   Risk factors: no recent hospitalization       Past Medical History:  Diagnosis Date   Asthma    Diabetes mellitus without complication (HCC)    not taking metformin anymore, hasn't seen MD for this since 2014   Hypertension      Prior to Admission medications   Medication Sig Start Date End Date Taking? Authorizing Provider  hyoscyamine (LEVSIN/SL) 0.125 MG SL tablet Place 1 tablet (0.125 mg total) under the tongue every 6 (six) hours as needed. 01/27/24  Yes Asmi Fugere, MD  ondansetron  (ZOFRAN -ODT) 8 MG disintegrating tablet 8mg  ODT q8 hours prn nausea 01/27/24  Yes Lara Palinkas, MD  albuterol (PROVENTIL HFA;VENTOLIN HFA) 108 (90 Base) MCG/ACT inhaler Inhale 1 puff into the lungs every 6 (six) hours as needed for wheezing or shortness of breath. Patient not taking: Reported on 12/15/2022    [provider]  cetirizine (ZYRTEC) 10 MG tablet Take 10 mg by mouth daily as needed.    [provider]  diphenhydrAMINE  (BENADRYL ) 25 MG tablet Take 1 tablet (25 mg total) by mouth every 6 (six) hours for 5 days. Patient not taking: Reported on 09/15/2021 04/18/19 04/23/19  Trine Raynell Moder, MD  EPINEPHrine  0.3 mg/0.3 mL IJ SOAJ injection Inject 0.3 mLs (0.3 mg total) into the muscle as needed for anaphylaxis. Patient not  taking: Reported on 12/15/2022 04/18/19   Trine Raynell Moder, MD  HYDROcodone -acetaminophen  (NORCO) 5-325 MG tablet Take 1 tablet by mouth every 6 (six) hours as needed for severe pain. Patient not taking: Reported on 12/15/2022 09/16/21   Elnor Hila P, DO  hydrOXYzine  (ATARAX /VISTARIL ) 25 MG tablet Take 1 tablet (25 mg total) by mouth every 6 (six) hours. Patient not taking: Reported on 09/15/2021 08/25/19   Hall-Potvin, [place], PA-C  ibuprofen  (ADVIL ) 600 MG tablet Take 1 tablet (600 mg total) by mouth every 6 (six) hours as needed for mild pain. Patient not taking: Reported on 12/15/2022 09/16/21   Elnor Hila P, DO  mupirocin  cream (BACTROBAN ) 2 % Apply 1 application. topically 2 (two) times daily. Patient not taking: Reported on 12/15/2022 09/16/21   Elnor Hila P, DO  ondansetron  (ZOFRAN  ODT) 4 MG disintegrating tablet Take 1 tablet (4 mg total) by mouth every 8 (eight) hours as needed for nausea or vomiting. Patient not taking: Reported on 09/15/2021 12/12/20   Windle Almarie ORN, PA-C  phentermine 37.5 MG capsule Take 37.5 mg by mouth daily. 11/06/22   [provider]    Allergies: Food, Shellfish allergy, and Penicillins    Review of Systems  Constitutional:  Negative for fever.  HENT:  Negative for facial swelling.   Respiratory:  Negative for wheezing and stridor.   Gastrointestinal:  Positive for abdominal pain, diarrhea, nausea and vomiting.  All other systems reviewed and are negative.   Updated Vital Signs  BP 111/62 (BP Location: Left Arm)   Pulse 63   Temp 98.5 F (36.9 C) (Oral)   Resp 16   Ht 5' 4 (1.626 m)   Wt 108 kg   SpO2 98%   BMI 40.87 kg/m   Physical Exam Vitals and nursing note reviewed.  Constitutional:      General: She is not in acute distress.    Appearance: Normal appearance. She is well-developed.  HENT:     Head: Normocephalic and atraumatic.     Nose: Nose normal.  Eyes:     Pupils: Pupils are equal, round, and reactive to light.   Cardiovascular:     Rate and Rhythm: Normal rate and regular rhythm.     Pulses: Normal pulses.     Heart sounds: Normal heart sounds.  Pulmonary:     Effort: Pulmonary effort is normal. No respiratory distress.     Breath sounds: Normal breath sounds.  Abdominal:     General: Bowel sounds are normal. There is no distension.     Palpations: Abdomen is soft.     Tenderness: There is no abdominal tenderness. There is no guarding or rebound. Negative signs include Murphy's sign and McBurney's sign.  Musculoskeletal:        General: Normal range of motion.     Cervical back: Neck supple.  Skin:    General: Skin is dry.     Capillary Refill: Capillary refill takes less than 2 seconds.     Findings: No erythema or rash.  Neurological:     General: No focal deficit present.     Mental Status: She is alert.     Deep Tendon Reflexes: Reflexes normal.  Psychiatric:        Mood and Affect: Mood normal.     (all labs ordered are listed, but only abnormal results are displayed) Results for orders placed or performed during the hospital encounter of 01/27/24  Lipase, blood   Collection Time: 01/27/24  3:34 AM  Result Value Ref Range   Lipase 33 11 - 51 U/L  Comprehensive metabolic panel   Collection Time: 01/27/24  3:34 AM  Result Value Ref Range   Sodium 137 135 - 145 mmol/L   Potassium 3.5 3.5 - 5.1 mmol/L   Chloride 106 98 - 111 mmol/L   CO2 19 (L) 22 - 32 mmol/L   Glucose, Bld 90 70 - 99 mg/dL   BUN 9 6 - 20 mg/dL   Creatinine, Ser 9.64 (L) 0.44 - 1.00 mg/dL   Calcium  9.3 8.9 - 10.3 mg/dL   Total Protein 7.8 6.5 - 8.1 g/dL   Albumin 4.1 3.5 - 5.0 g/dL   AST 16 15 - 41 U/L   ALT 9 0 - 44 U/L   Alkaline Phosphatase 64 38 - 126 U/L   Total Bilirubin 1.0 0.0 - 1.2 mg/dL   GFR, Estimated >39 >39 mL/min   Anion gap 12 5 - 15  CBC   Collection Time: 01/27/24  3:34 AM  Result Value Ref Range   WBC 5.3 4.0 - 10.5 K/uL   RBC 4.24 3.87 - 5.11 MIL/uL   Hemoglobin 13.6 12.0 - 15.0  g/dL   HCT 59.8 63.9 - 53.9 %   MCV 94.6 80.0 - 100.0 fL   MCH 32.1 26.0 - 34.0 pg   MCHC 33.9 30.0 - 36.0 g/dL   RDW 87.0 88.4 - 84.4 %   Platelets 231 150 - 400 K/uL   nRBC 0.0 0.0 -  0.2 %  Urinalysis, Routine w reflex microscopic -Urine, Clean Catch   Collection Time: 01/27/24  3:34 AM  Result Value Ref Range   Color, Urine YELLOW YELLOW   APPearance CLEAR CLEAR   Specific Gravity, Urine >1.046 (H) 1.005 - 1.030   pH 5.0 5.0 - 8.0   Glucose, UA NEGATIVE NEGATIVE mg/dL   Hgb urine dipstick NEGATIVE NEGATIVE   Bilirubin Urine NEGATIVE NEGATIVE   Ketones, ur 80 (A) NEGATIVE mg/dL   Protein, ur NEGATIVE NEGATIVE mg/dL   Nitrite NEGATIVE NEGATIVE   Leukocytes,Ua NEGATIVE NEGATIVE  hCG, serum, qualitative   Collection Time: 01/27/24  3:34 AM  Result Value Ref Range   Preg, Serum NEGATIVE NEGATIVE   CT ABDOMEN PELVIS W CONTRAST Result Date: 01/27/2024 EXAM: CT ABDOMEN AND PELVIS WITH CONTRAST 01/27/2024 05:15:39 AM TECHNIQUE: CT of the abdomen and pelvis was performed with the administration of intravenous contrast. Multiplanar reformatted images are provided for review. Automated exposure control, iterative reconstruction, and/or weight based adjustment of the mA/kV was utilized to reduce the radiation dose to as low as reasonably achievable. COMPARISON: 09/16/2021 CLINICAL HISTORY: Abdominal pain, acute, nonlocalized. She started having lower abdominal pain around 1600 and went to bed around 2200 and it has now moved to mid abdomen. Endorses n/v, wbc's 5.3, GFR>60, negative HCG 01/27/24, hx of HTN, diabetes, and sleeve gastroplasty, prev ct a/p 09/16/21. FINDINGS: LOWER CHEST: No acute abnormality. LIVER: The liver is unremarkable. GALLBLADDER AND BILE DUCTS: Gallbladder is unremarkable. No biliary ductal dilatation. SPLEEN: No acute abnormality. PANCREAS: No acute abnormality. ADRENAL GLANDS: No acute abnormality. KIDNEYS, URETERS AND BLADDER: No stones in the kidneys or ureters. No  hydronephrosis. No perinephric or periureteral stranding. Urinary bladder is unremarkable. GI AND BOWEL: Within the left lower quadrant of the abdomen there are several small bowel loops with equivocal wall thickening. There are 2 segments of small bowel within the right hemiabdomen which are upper limits of normal measuring 2.9 cm in maximum diameter. Liquid stool is noted within the right colon. Hyperaemia of the basal rectum. PERITONEUM AND RETROPERITONEUM: There is a trace amount of free fluid noted within the left posterior pelvis. No signs of pneumoperitoneum. VASCULATURE: Aorta is normal in caliber. LYMPH NODES: No lymphadenopathy. REPRODUCTIVE ORGANS: No acute abnormality. BONES AND SOFT TISSUES: Visualized osseous structures are unremarkable. No acute osseous abnormality. No focal soft tissue abnormality. No abdominal wall hernia. IMPRESSION: 1. Several Small bowel loops in the left lower quadrant with equivocal wall thickening. Nonspecific but possibly related to enteritis. 2. Two segments of small bowel in the right hemiabdomen are at the upper limits of normal diameter, measuring 2.9 cm. This may also be related to nonspecific enteritis. Focal ileus versus low-grade partial obstruction or differential considerations. 3. Trace free fluid in the left posterior pelvis. Electronically signed by: Waddell Calk MD 01/27/2024 05:32 AM EDT RP Workstation: [id number]     Radiology: CT ABDOMEN PELVIS W CONTRAST Result Date: 01/27/2024 EXAM: CT ABDOMEN AND PELVIS WITH CONTRAST 01/27/2024 05:15:39 AM TECHNIQUE: CT of the abdomen and pelvis was performed with the administration of intravenous contrast. Multiplanar reformatted images are provided for review. Automated exposure control, iterative reconstruction, and/or weight based adjustment of the mA/kV was utilized to reduce the radiation dose to as low as reasonably achievable. COMPARISON: 09/16/2021 CLINICAL HISTORY: Abdominal pain, acute, nonlocalized. She  started having lower abdominal pain around 1600 and went to bed around 2200 and it has now moved to mid abdomen. Endorses n/v, wbc's 5.3, GFR>60, negative HCG 01/27/24, hx  of HTN, diabetes, and sleeve gastroplasty, prev ct a/p 09/16/21. FINDINGS: LOWER CHEST: No acute abnormality. LIVER: The liver is unremarkable. GALLBLADDER AND BILE DUCTS: Gallbladder is unremarkable. No biliary ductal dilatation. SPLEEN: No acute abnormality. PANCREAS: No acute abnormality. ADRENAL GLANDS: No acute abnormality. KIDNEYS, URETERS AND BLADDER: No stones in the kidneys or ureters. No hydronephrosis. No perinephric or periureteral stranding. Urinary bladder is unremarkable. GI AND BOWEL: Within the left lower quadrant of the abdomen there are several small bowel loops with equivocal wall thickening. There are 2 segments of small bowel within the right hemiabdomen which are upper limits of normal measuring 2.9 cm in maximum diameter. Liquid stool is noted within the right colon. Hyperaemia of the basal rectum. PERITONEUM AND RETROPERITONEUM: There is a trace amount of free fluid noted within the left posterior pelvis. No signs of pneumoperitoneum. VASCULATURE: Aorta is normal in caliber. LYMPH NODES: No lymphadenopathy. REPRODUCTIVE ORGANS: No acute abnormality. BONES AND SOFT TISSUES: Visualized osseous structures are unremarkable. No acute osseous abnormality. No focal soft tissue abnormality. No abdominal wall hernia. IMPRESSION: 1. Several Small bowel loops in the left lower quadrant with equivocal wall thickening. Nonspecific but possibly related to enteritis. 2. Two segments of small bowel in the right hemiabdomen are at the upper limits of normal diameter, measuring 2.9 cm. This may also be related to nonspecific enteritis. Focal ileus versus low-grade partial obstruction or differential considerations. 3. Trace free fluid in the left posterior pelvis. Electronically signed by: Waddell Calk MD 01/27/2024 05:32 AM EDT RP  Workstation: [id number]     Procedures   Medications Ordered in the ED  iohexol  (OMNIPAQUE ) 300 MG/ML solution 100 mL (100 mLs Intravenous Contrast Given 01/27/24 0502)  acetaminophen  (OFIRMEV ) IV 1,000 mg (0 mg Intravenous Stopped 01/27/24 0611)  ondansetron  (ZOFRAN ) injection 4 mg (4 mg Intravenous Given 01/27/24 0624)  ketorolac  (TORADOL ) 30 MG/ML injection 30 mg (30 mg Intravenous Given 01/27/24 9375)                                    Medical Decision Making Patient with pain n/v/d.   Amount and/or Complexity of Data Reviewed External Data Reviewed: notes.    Details: Previous notes reviewed  Labs: ordered.    Details: Normal lipase 33, normal sodium 137, normal potassium 3.5, normal creatinine 0.35, urine is without UTI. Normal white count 5.3, normal hemoglobin 13.6  Radiology: ordered.  Risk Prescription drug management. Risk Details: Patient with enteritis and ileus.  She is passing gas and stool.  I have explained this to the patient and will send patient home with nausea medication and anti-spasmotic.  I have encouraged patient to drink copious liquids.  Patient is stable for discharge.  Strict return precautions for any new or worsening symptoms given.       Final diagnoses:  Nausea vomiting and diarrhea  Enteritis  Ileus (HCC)    No signs of systemic illness or infection. The patient is nontoxic-appearing on exam and vital signs are within normal limits.  I have reviewed the triage vital signs and the nursing notes. Pertinent labs & imaging results that were available during my care of the patient were reviewed by me and considered in my medical decision making (see chart for details). After history, exam, and medical workup I feel the patient has been appropriately medically screened and is safe for discharge home. Pertinent diagnoses were discussed with the patient. Patient was given return  precautions.      ED Discharge Orders          Ordered    ondansetron   (ZOFRAN -ODT) 8 MG disintegrating tablet        01/27/24 0606    hyoscyamine (LEVSIN/SL) 0.125 MG SL tablet  Every 6 hours PRN        01/27/24 0613               Jisele Price, MD 01/27/24 9275

## 2024-01-27 NOTE — ED Notes (Signed)
 Pt ambulated to bathroom

## 2024-01-27 NOTE — ED Notes (Signed)
 Patient transported to CT

## 2024-01-27 NOTE — ED Triage Notes (Signed)
 Pt states that she started having lower abdominal pain around 1600 and went to bed around 2200 and it has now moved to mid abdomen. Endorses n/v. Denies any urinary sx, fever, chills. lightheadedness, dizziness, or shob.

## 2024-05-14 ENCOUNTER — Encounter: Payer: Self-pay | Admitting: Radiology
